# Patient Record
Sex: Female | Born: 1980 | Race: Black or African American | Hispanic: No | Marital: Single | Smoking: Former smoker
Health system: Southern US, Community
[De-identification: ages and names within clinical notes are randomized; demographics above are authoritative.]

## PROBLEM LIST (undated history)

## (undated) DIAGNOSIS — F32A Depression, unspecified: Secondary | ICD-10-CM

## (undated) DIAGNOSIS — R55 Syncope and collapse: Secondary | ICD-10-CM

## (undated) DIAGNOSIS — R7989 Other specified abnormal findings of blood chemistry: Secondary | ICD-10-CM

## (undated) DIAGNOSIS — F419 Anxiety disorder, unspecified: Secondary | ICD-10-CM

## (undated) DIAGNOSIS — D649 Anemia, unspecified: Secondary | ICD-10-CM

## (undated) DIAGNOSIS — I10 Essential (primary) hypertension: Secondary | ICD-10-CM

## (undated) DIAGNOSIS — R0600 Dyspnea, unspecified: Secondary | ICD-10-CM

## (undated) DIAGNOSIS — R87629 Unspecified abnormal cytological findings in specimens from vagina: Secondary | ICD-10-CM

## (undated) DIAGNOSIS — F329 Major depressive disorder, single episode, unspecified: Secondary | ICD-10-CM

## (undated) DIAGNOSIS — R945 Abnormal results of liver function studies: Secondary | ICD-10-CM

## (undated) DIAGNOSIS — G4719 Other hypersomnia: Secondary | ICD-10-CM

## (undated) DIAGNOSIS — T796XXA Traumatic ischemia of muscle, initial encounter: Secondary | ICD-10-CM

## (undated) DIAGNOSIS — R519 Headache, unspecified: Secondary | ICD-10-CM

## (undated) HISTORY — DX: Unspecified abnormal cytological findings in specimens from vagina: R87.629

## (undated) HISTORY — DX: Other hypersomnia: G47.19

## (undated) HISTORY — DX: Other specified abnormal findings of blood chemistry: R79.89

## (undated) HISTORY — DX: Morbid (severe) obesity due to excess calories: E66.01

## (undated) HISTORY — DX: Dyspnea, unspecified: R06.00

## (undated) HISTORY — DX: Headache, unspecified: R51.9

## (undated) HISTORY — DX: Syncope and collapse: R55

## (undated) HISTORY — PX: WISDOM TOOTH EXTRACTION: SHX21

## (undated) HISTORY — DX: Abnormal results of liver function studies: R94.5

## (undated) HISTORY — DX: Traumatic ischemia of muscle, initial encounter: T79.6XXA

## (undated) HISTORY — DX: Anemia, unspecified: D64.9

## (undated) HISTORY — PX: OTHER SURGICAL HISTORY: SHX169

---

## 2004-11-12 ENCOUNTER — Encounter: Admission: RE | Admit: 2004-11-12 | Discharge: 2004-11-12 | Payer: Self-pay | Admitting: Emergency Medicine

## 2005-11-06 ENCOUNTER — Emergency Department (HOSPITAL_COMMUNITY): Admission: EM | Admit: 2005-11-06 | Discharge: 2005-11-06 | Payer: Self-pay | Admitting: Emergency Medicine

## 2006-08-14 ENCOUNTER — Emergency Department (HOSPITAL_COMMUNITY): Admission: EM | Admit: 2006-08-14 | Discharge: 2006-08-14 | Payer: Self-pay | Admitting: Emergency Medicine

## 2010-04-16 ENCOUNTER — Ambulatory Visit (HOSPITAL_COMMUNITY)
Admission: RE | Admit: 2010-04-16 | Discharge: 2010-04-16 | Disposition: A | Discharge status: Home / Self Care | Payer: BC Managed Care – PPO | Source: Ambulatory Visit | Attending: Specialist | Admitting: Specialist

## 2010-04-16 ENCOUNTER — Other Ambulatory Visit (HOSPITAL_COMMUNITY): Payer: Self-pay | Admitting: Specialist

## 2010-04-16 DIAGNOSIS — R52 Pain, unspecified: Secondary | ICD-10-CM

## 2010-04-16 DIAGNOSIS — Z3689 Encounter for other specified antenatal screening: Secondary | ICD-10-CM | POA: Insufficient documentation

## 2010-04-16 DIAGNOSIS — R55 Syncope and collapse: Secondary | ICD-10-CM

## 2010-04-24 ENCOUNTER — Other Ambulatory Visit (HOSPITAL_COMMUNITY): Payer: Self-pay | Admitting: Specialist

## 2010-04-24 DIAGNOSIS — O3680X0 Pregnancy with inconclusive fetal viability, not applicable or unspecified: Secondary | ICD-10-CM

## 2010-04-27 ENCOUNTER — Ambulatory Visit (HOSPITAL_COMMUNITY)
Admission: RE | Admit: 2010-04-27 | Discharge: 2010-04-27 | Disposition: A | Discharge status: Home / Self Care | Payer: BC Managed Care – PPO | Source: Ambulatory Visit | Attending: Specialist | Admitting: Specialist

## 2010-04-27 DIAGNOSIS — Z3689 Encounter for other specified antenatal screening: Secondary | ICD-10-CM | POA: Insufficient documentation

## 2010-04-27 DIAGNOSIS — O3680X0 Pregnancy with inconclusive fetal viability, not applicable or unspecified: Secondary | ICD-10-CM

## 2010-12-20 ENCOUNTER — Ambulatory Visit (HOSPITAL_COMMUNITY)
Admission: RE | Admit: 2010-12-20 | Discharge: 2010-12-20 | Disposition: A | Discharge status: Home / Self Care | Payer: BC Managed Care – PPO | Source: Ambulatory Visit | Attending: Emergency Medicine | Admitting: Emergency Medicine

## 2010-12-20 NOTE — Progress Notes (Signed)
Adult Lactation Consultation Outpatient Visit Note  Patient Name: Ashley Pham             BABY : Ashley Pham  13 DO Date of Birth: 1980/05/23                    DOB:  [birth year] Gestational Age at Delivery: TERM   BIRTH WEIGHT:  5-8 Type of Delivery: NSVD                     WEIGHT TODAY:  5-11.8  Breastfeeding History: Frequency of Breastfeeding: ATTEMPTS EVERY 2-3 HOURS.  SUCCESSFUL LATCHES TWICE/24 HRS Length of Feeding: 10-30 MIN Voids: 6+ Stools:6+   Supplementing / Method:  BOTTLE EVERY 2- 3 HOURS   45-60 mls expressed milk Pumping:  Type of Pump:LACTINA DEBP   Frequency:3 TIMES/24 HOURS  Volume:  1/2 OZ-1 OZ EACH BREAST  Comments:    Consultation Evaluation:  Mother here with c/o of difficult latch.  Assisted with baby in football hold on right breast.  Milk easily expressed but baby unable to latch after several attempts.  #20 nipple shield placed and baby did latch and begin sucking but only few swallows heard.  SNS initiated and nipple shield removed and baby latched deeply and nursed for 15 minutes taking 40 mls of expressed milk from SNS.  Baby burped and placed in cross cradle hold on left breast but nipple shield needed to attain latch.  Baby nursed for 10 min with many audible swallows/gulps heard.  Mom pleased.  Plan:  1)  Feed baby on cue but at least every 3 hours  2)  Use nipple shield prn  3) Offer 30-45 mls of expressed milk per bottle after feedings or use SNS at breast with same amounts.  4) increase pumping to every 3 hours x 15-20 minutes after feedings  Initial Feeding Assessment: Pre-feed Weight:2602 Post-feed JYNWGN:[id number] (40 mls expressed milk from SNS) Amount Transferred:76 MLS TOTAL  Both breasts Comments:36 MLS + 40 MLS SNS  Additional Feeding Assessment: Pre-feed Weight: Post-feed Weight: Amount Transferred: Comments:  Additional Feeding Assessment: Pre-feed Weight: Post-feed Weight: Amount Transferred: Comments:  Total Breast milk  Transferred this Visit:  Total Supplement Given: NONE  Additional Interventions:   Follow-Up  OUTPATIENT LACTATION APPOINTMENT  Thursday December 13, 1:00PM      Ashley Pham 12/20/2010, 5:24 PM

## 2010-12-27 ENCOUNTER — Inpatient Hospital Stay (HOSPITAL_COMMUNITY): Admission: RE | Admit: 2010-12-27 | Discharge: 2010-12-27 | Payer: BC Managed Care – PPO | Source: Ambulatory Visit

## 2010-12-27 NOTE — Consult Note (Signed)
Adult Lactation Consultation Outpatient Visit Note  Patient Name: Ashley Pham  [name] Date of Birth: November 15, 1980  12-10-10 Gestational Age at Delivery: Unknown  39 5/7 weeks Type of Delivery: Vaginal  Breastfeeding History: Frequency of Breastfeeding:  Every 2 hours last 2 days Length of Feeding: 60 mins Voids: 4-5/day Stools: 4-5/day yellow seedy  Supplementing / Method: Pumping:  Type of Pump: Lactina (WIC pump)   Frequency: every 3 hrs  Volume:  30-45 mls  Comments:  Baby weighed 5 lbs. 15 oz today. Observed Tzipora latching baby in cross cradle hold.  Baby eagerly rooted, but latched onto nipple only.  Shunna said that was how she usually does at home (Mom has large breasts with semi flat nipples that evert with stimulation.  Baby has little mouth).  Explained, assisted, and demonstrated how to latch baby deeper onto breast.  Mom is aware of the difference and what to look for if baby is latched onto nipple only. At home Mom has been giving baby her pumped milk in a bottle (Nuk nipple).  She pumps 1- 1.5 oz per pumping, and is worried she doesn't have enough milk for baby.     Consultation Evaluation: Assistance given to reposition baby in cross cradle and then football hold.  Baby able to latch much better in football hold, and Mom able to support her better, and support her breast (rolled wash cloth under breast) more adequately.  Initial Feeding Assessment: Pre-feed Weight: 2694 gm Post-feed Weight: 2712 gm Amount Transferred: 18  ml Comments: left breast, without nipple shield using breast compression and skin to skin to keep baby nutritive. Baby became sleepy after 10 mins.    Additional Feeding Assessment: Pre-feed Weight: 2712 gm Post-feed Weight: 2726 gm Amount Transferred:14 ml Comments: right breast 15 mins.  Additional Feeding Assessment:with nipple shield Pre-feed Weight: 2726 gm Post-feed Weight: 2730 gm Amount Transferred: 4 ml Comments: 36  ml  Total Breast milk Transferred this Visit: 36 ml Total Supplement Given: 30 ml  Baby able to latch to breast deep enough to transfer milk, with lots of stimulation to keep her actively nursing.  Lots of teaching done on what a good latch and feeding looks and feels like.  Mom does not use the SNS, and use of nipple shield did not aid in milk transfer.  Mom to work on latching baby deep onto areola, rather than shallow on nipple. To give baby 30 ml expressed breast milk after breast, and pump both breasts 15 minutes. Locklyn's milk supply appears adequate but baby still unable to adequately transfer milk from the breast without supplementing.       Follow-Up  Tuesday, December 18th @ 1pm    Judee Clara 12/27/2010, 1:48 PM

## 2011-01-01 ENCOUNTER — Ambulatory Visit (HOSPITAL_COMMUNITY)
Admission: RE | Admit: 2011-01-01 | Discharge: 2011-01-01 | Disposition: A | Discharge status: Home / Self Care | Payer: BC Managed Care – PPO | Source: Ambulatory Visit | Attending: Emergency Medicine | Admitting: Emergency Medicine

## 2011-01-01 NOTE — Progress Notes (Signed)
Adult Lactation Consultation Outpatient Visit Note  Patient Name: Ashley Pham Date of Birth: May 21, 1980 Gestational Age at Delivery: Unknown Type of Delivery:vaginal del on 12/10/10   Breastfeeding History: Frequency of Breastfeeding: every 2hrs Length of Feeding: 30-45 mins Voids: 5-6 Stools: 4 yellow seedy  Supplementing / Method: Pumping:  Type of Pump:lactina   Frequency:3 times day  Volume:  15-30 ml  Comments: Follow up today for weight check and feeding assessment. Mother has been exclusively breastfeeding, has not given formula for 1 week. Mother is pumping 3-4 times daily and getting 1/2 -1 ounce each pumping session . Mother plans to return to work in January .    Consultation Evaluation: [place]ryah fed on first breast in x cradle hold for 20  Mins. Mother took her off several times for burps and to re-awake infant. Infant took 62 ml for first breast. Mother then placed her on 2nd breast in football hold and breast fed for apprx 12-15 mins. Infant transferred 26 ml. Infant took total of 88 ml for entire feeding. Mothers technique in latching infant independently was much improved. Mother was given lots of support and praise. Plan for mother to come to support group on January 8 for pre and post weights . Plan for mother to continue to pump at least 3 times daily after feedings.   Initial Feeding Assessment: Pre-feed ZOXWRU:0454 Post-feed [id number]:1478 Amount Transferred:28ml Comments:  Additional Feeding Assessment: Pre-feed GNFAOZ:3086 Post-feed VHQION:[id number] Amount Transferred:42ml Comments:  Additional Feeding Assessment: Pre-feed Weight: Post-feed Weight: Amount Transferred: Comments:  Total Breast milk Transferred this Visit: 88ml Total Supplement Given:   Additional Interventions:   Follow-Up  PRN    Stevan Born [facility] 01/01/2011, 1:27 PM

## 2013-09-28 ENCOUNTER — Emergency Department (HOSPITAL_COMMUNITY)
Admission: EM | Admit: 2013-09-28 | Discharge: 2013-09-28 | Disposition: A | Discharge status: Home / Self Care | Payer: BC Managed Care – PPO | Source: Home / Self Care

## 2013-09-28 ENCOUNTER — Encounter (HOSPITAL_COMMUNITY): Payer: Self-pay | Admitting: Emergency Medicine

## 2013-09-28 DIAGNOSIS — J3089 Other allergic rhinitis: Secondary | ICD-10-CM

## 2013-09-28 DIAGNOSIS — G4700 Insomnia, unspecified: Secondary | ICD-10-CM

## 2013-09-28 DIAGNOSIS — K219 Gastro-esophageal reflux disease without esophagitis: Secondary | ICD-10-CM

## 2013-09-28 HISTORY — DX: Essential (primary) hypertension: I10

## 2013-09-28 MED ORDER — PANTOPRAZOLE SODIUM 40 MG PO TBEC: 40.0000 mg | DELAYED_RELEASE_TABLET | Freq: Every day | ORAL | Status: DC

## 2013-09-28 MED ORDER — HYDROXYZINE HCL 50 MG PO TABS: ORAL_TABLET | ORAL | Status: DC

## 2013-09-28 MED ORDER — RANITIDINE HCL 150 MG PO CAPS: ORAL_CAPSULE | ORAL | Status: DC

## 2013-09-28 NOTE — Discharge Instructions (Signed)
Allergic Rhinitis Allegra or claritin Sudafed PE 10 mg for congestion Flonase nasal spray Lots of saline nasal spray  Allergic rhinitis is when the mucous membranes in the nose respond to allergens. Allergens are particles in the air that cause your body to have an allergic reaction. This causes you to release allergic antibodies. Through a chain of events, these eventually cause you to release histamine into the blood stream. Although meant to protect the body, it is this release of histamine that causes your discomfort, such as frequent sneezing, congestion, and an itchy, runny nose.  CAUSES  Seasonal allergic rhinitis (hay fever) is caused by pollen allergens that may come from grasses, trees, and weeds. Year-round allergic rhinitis (perennial allergic rhinitis) is caused by allergens such as house dust mites, pet dander, and mold spores.  SYMPTOMS   Nasal stuffiness (congestion).  Itchy, runny nose with sneezing and tearing of the eyes. DIAGNOSIS  Your health care provider can help you determine the allergen or allergens that trigger your symptoms. If you and your health care provider are unable to determine the allergen, skin or blood testing may be used. TREATMENT  Allergic rhinitis does not have a cure, but it can be controlled by:  Medicines and allergy shots (immunotherapy).  Avoiding the allergen. Hay fever may often be treated with antihistamines in pill or nasal spray forms. Antihistamines block the effects of histamine. There are over-the-counter medicines that may help with nasal congestion and swelling around the eyes. Check with your health care provider before taking or giving this medicine.  If avoiding the allergen or the medicine prescribed do not work, there are many new medicines your health care provider can prescribe. Stronger medicine may be used if initial measures are ineffective. Desensitizing injections can be used if medicine and avoidance does not work.  Desensitization is when a patient is given ongoing shots until the body becomes less sensitive to the allergen. Make sure you follow up with your health care provider if problems continue. HOME CARE INSTRUCTIONS It is not possible to completely avoid allergens, but you can reduce your symptoms by taking steps to limit your exposure to them. It helps to know exactly what you are allergic to so that you can avoid your specific triggers. SEEK MEDICAL CARE IF:   You have a fever.  You develop a cough that does not stop easily (persistent).  You have shortness of breath.  You start wheezing.  Symptoms interfere with normal daily activities. Document Released: 09/25/2000 Document Revised: 01/05/2013 Document Reviewed: 09/07/2012 [facility] Patient Information 2015 [place], [place]. This information is not intended to replace advice given to you by your health care provider. Make sure you discuss any questions you have with your health care provider.  Gastroesophageal Reflux Disease, Adult Gastroesophageal reflux disease (GERD) happens when acid from your stomach flows up into the esophagus. When acid comes in contact with the esophagus, the acid causes soreness (inflammation) in the esophagus. Over time, GERD may create small holes (ulcers) in the lining of the esophagus. CAUSES   Increased body weight. This puts pressure on the stomach, making acid rise from the stomach into the esophagus.  Smoking. This increases acid production in the stomach.  Drinking alcohol. This causes decreased pressure in the lower esophageal sphincter (valve or ring of muscle between the esophagus and stomach), allowing acid from the stomach into the esophagus.  Late evening meals and a full stomach. This increases pressure and acid production in the stomach.  A malformed lower  esophageal sphincter. Sometimes, no cause is found. SYMPTOMS   Burning pain in the lower part of the mid-chest behind the breastbone and in  the mid-stomach area. This may occur twice a week or more often.  Trouble swallowing.  Sore throat.  Dry cough.  Asthma-like symptoms including chest tightness, shortness of breath, or wheezing. DIAGNOSIS  Your caregiver may be able to diagnose GERD based on your symptoms. In some cases, X-rays and other tests may be done to check for complications or to check the condition of your stomach and esophagus. TREATMENT  Your caregiver may recommend over-the-counter or prescription medicines to help decrease acid production. Ask your caregiver before starting or adding any new medicines.  HOME CARE INSTRUCTIONS   Change the factors that you can control. Ask your caregiver for guidance concerning weight loss, quitting smoking, and alcohol consumption.  Avoid foods and drinks that make your symptoms worse, such as:  Caffeine or alcoholic drinks.  Chocolate.  Peppermint or mint flavorings.  Garlic and onions.  Spicy foods.  Citrus fruits, such as oranges, lemons, or limes.  Tomato-based foods such as sauce, chili, salsa, and pizza.  Fried and fatty foods.  Avoid lying down for the 3 hours prior to your bedtime or prior to taking a nap.  Eat small, frequent meals instead of large meals.  Wear loose-fitting clothing. Do not wear anything tight around your waist that causes pressure on your stomach.  Raise the head of your bed 6 to 8 inches with wood blocks to help you sleep. Extra pillows will not help.  Only take over-the-counter or prescription medicines for pain, discomfort, or fever as directed by your caregiver.  Do not take aspirin, ibuprofen, or other nonsteroidal anti-inflammatory drugs (NSAIDs). SEEK IMMEDIATE MEDICAL CARE IF:   You have pain in your arms, neck, jaw, teeth, or back.  Your pain increases or changes in intensity or duration.  You develop nausea, vomiting, or sweating (diaphoresis).  You develop shortness of breath, or you faint.  Your vomit is  green, yellow, black, or looks like coffee grounds or blood.  Your stool is red, bloody, or black. These symptoms could be signs of other problems, such as heart disease, gastric bleeding, or esophageal bleeding. MAKE SURE YOU:   Understand these instructions.  Will watch your condition.  Will get help right away if you are not doing well or get worse. Document Released: 10/10/2004 Document Revised: 03/25/2011 Document Reviewed: 07/20/2010 [facility] Patient Information 2015 [place], [place]. This information is not intended to replace advice given to you by your health care provider. Make sure you discuss any questions you have with your health care provider.  Food Choices for Gastroesophageal Reflux Disease When you have gastroesophageal reflux disease (GERD), the foods you eat and your eating habits are very important. Choosing the right foods can help ease your discomfort.  WHAT GUIDELINES DO I NEED TO FOLLOW?   Choose fruits, vegetables, whole grains, and low-fat dairy products.   Choose low-fat meat, fish, and poultry.  Limit fats such as oils, salad dressings, butter, nuts, and avocado.   Keep a food diary. This helps you identify foods that cause symptoms.   Avoid foods that cause symptoms. These may be different for everyone.   Eat small meals often instead of 3 large meals a day.   Eat your meals slowly, in a place where you are relaxed.   Limit fried foods.   Cook foods using methods other than frying.   Avoid drinking alcohol.  Avoid drinking large amounts of liquids with your meals.   Avoid bending over or lying down until 2-3 hours after eating.  WHAT FOODS ARE NOT RECOMMENDED?  These are some foods and drinks that may make your symptoms worse: Vegetables Tomatoes. Tomato juice. Tomato and spaghetti sauce. Chili peppers. Onion and garlic. Horseradish. Fruits Oranges, grapefruit, and lemon (fruit and juice). Meats High-fat meats, fish, and  poultry. This includes hot dogs, ribs, ham, sausage, salami, and bacon. Dairy Whole milk and chocolate milk. Sour cream. Cream. Butter. Ice cream. Cream cheese.  Drinks Coffee and tea. Bubbly (carbonated) drinks or energy drinks. Condiments Hot sauce. Barbecue sauce.  Sweets/Desserts Chocolate and cocoa. Donuts. Peppermint and spearmint. Fats and Oils High-fat foods. This includes [place] fries and potato chips. Other Vinegar. Strong spices. This includes black pepper, white pepper, red pepper, cayenne, curry powder, cloves, ginger, and chili powder. The items listed above may not be a complete list of foods and drinks to avoid. Contact your dietitian for more information. Document Released: 07/02/2011 Document Revised: 01/05/2013 Document Reviewed: 11/04/2012 [facility] Patient Information 2015 [place], [place]. This information is not intended to replace advice given to you by your health care provider. Make sure you discuss any questions you have with your health care provider.  Insomnia Insomnia is frequent trouble falling and/or staying asleep. Insomnia can be a long term problem or a short term problem. Both are common. Insomnia can be a short term problem when the wakefulness is related to a certain stress or worry. Long term insomnia is often related to ongoing stress during waking hours and/or poor sleeping habits. Overtime, sleep deprivation itself can make the problem worse. Every little thing feels more severe because you are overtired and your ability to cope is decreased. CAUSES   Stress, anxiety, and depression.  Poor sleeping habits.  Distractions such as TV in the bedroom.  Naps close to bedtime.  Engaging in emotionally charged conversations before bed.  Technical reading before sleep.  Alcohol and other sedatives. They may make the problem worse. They can hurt normal sleep patterns and normal dream activity.  Stimulants such as caffeine for several hours prior to  bedtime.  Pain syndromes and shortness of breath can cause insomnia.  Exercise late at night.  Changing time zones may cause sleeping problems (jet lag). It is sometimes helpful to have someone observe your sleeping patterns. They should look for periods of not breathing during the night (sleep apnea). They should also look to see how long those periods last. If you live alone or observers are uncertain, you can also be observed at a sleep clinic where your sleep patterns will be professionally monitored. Sleep apnea requires a checkup and treatment. Give your caregivers your medical history. Give your caregivers observations your family has made about your sleep.  SYMPTOMS   Not feeling rested in the morning.  Anxiety and restlessness at bedtime.  Difficulty falling and staying asleep. TREATMENT   Your caregiver may prescribe treatment for an underlying medical disorders. Your caregiver can give advice or help if you are using alcohol or other drugs for self-medication. Treatment of underlying problems will usually eliminate insomnia problems.  Medications can be prescribed for short time use. They are generally not recommended for lengthy use.  Over-the-counter sleep medicines are not recommended for lengthy use. They can be habit forming.  You can promote easier sleeping by making lifestyle changes such as:  Using relaxation techniques that help with breathing and reduce muscle tension.  Exercising  earlier in the day.  Changing your diet and the time of your last meal. No night time snacks.  Establish a regular time to go to bed.  Counseling can help with stressful problems and worry.  Soothing music and white noise may be helpful if there are background noises you cannot remove.  Stop tedious detailed work at least one hour before bedtime. HOME CARE INSTRUCTIONS   Keep a diary. Inform your caregiver about your progress. This includes any medication side effects. See your  caregiver regularly. Take note of:  Times when you are asleep.  Times when you are awake during the night.  The quality of your sleep.  How you feel the next day. This information will help your caregiver care for you.  Get out of bed if you are still awake after 15 minutes. Read or do some quiet activity. Keep the lights down. Wait until you feel sleepy and go back to bed.  Keep regular sleeping and waking hours. Avoid naps.  Exercise regularly.  Avoid distractions at bedtime. Distractions include watching television or engaging in any intense or detailed activity like attempting to balance the household checkbook.  Develop a bedtime ritual. Keep a familiar routine of bathing, brushing your teeth, climbing into bed at the same time each night, listening to soothing music. Routines increase the success of falling to sleep faster.  Use relaxation techniques. This can be using breathing and muscle tension release routines. It can also include visualizing peaceful scenes. You can also help control troubling or intruding thoughts by keeping your mind occupied with boring or repetitive thoughts like the old concept of counting sheep. You can make it more creative like imagining planting one beautiful flower after another in your backyard garden.  During your day, work to eliminate stress. When this is not possible use some of the previous suggestions to help reduce the anxiety that accompanies stressful situations. MAKE SURE YOU:   Understand these instructions.  Will watch your condition.  Will get help right away if you are not doing well or get worse. Document Released: 12/29/1999 Document Revised: 03/25/2011 Document Reviewed: 01/28/2007 [facility] Patient Information 2015 [place], [place]. This information is not intended to replace advice given to you by your health care provider. Make sure you discuss any questions you have with your health care provider.

## 2013-09-28 NOTE — ED Provider Notes (Signed)
CSN: [id number]     Arrival date & time 09/28/13  1346 History   First MD Initiated Contact with Patient 09/28/13 1505     Chief Complaint  Patient presents with  . Hypertension  . Sleeping Problem  . Cough   (Consider location/radiation/quality/duration/timing/severity/associated sxs/prior Treatment) HPI Comments: 33 year old obese female with complaints of elevated blood pressure this morning. She reports a 170/90. It was normal in urgent care. She had some mild dizziness and transient upset stomach. Last night she had heartburn which has continued to have some degree today. Is also complaining of inability to sleep at nighttime which has been going on for several months and a cold for 3 weeks.   Past Medical History  Diagnosis Date  . Hypertension   . Diabetes mellitus without complication    History reviewed. No pertinent past surgical history. History reviewed. No pertinent family history. History  Substance Use Topics  . Smoking status: Current Every Day Smoker -- 0.50 packs/day    Types: Cigarettes  . Smokeless tobacco: Not on file  . Alcohol Use: No   OB History   Grav Para Term Preterm Abortions TAB SAB Ect Mult Living                 Review of Systems  Constitutional: Negative for fever, chills, activity change, appetite change and fatigue.  HENT: Positive for congestion, postnasal drip and rhinorrhea. Negative for facial swelling.   Eyes: Negative.   Respiratory: Negative.   Cardiovascular: Negative.   Gastrointestinal: Negative for vomiting, abdominal pain and constipation.       As per history of present illness  Genitourinary: Negative.   Musculoskeletal: Negative for neck pain and neck stiffness.  Skin: Negative for pallor and rash.  Neurological: Positive for dizziness. Negative for tremors, seizures, syncope, speech difficulty, numbness and headaches.  Psychiatric/Behavioral: Positive for sleep disturbance.    Allergies  Review of patient's allergies  indicates no known allergies.  Home Medications   Prior to Admission medications   Medication Sig Start Date End Date Taking? Authorizing Provider  Sertraline HCl (ZOLOFT PO) Take by mouth.   Yes Historical Provider, MD  VERAPAMIL HCL ER, CO, PO Take by mouth.   Yes Historical Provider, MD  hydrOXYzine (ATARAX/VISTARIL) 50 MG tablet Take one one hour before hs prn sleep 09/28/13   Hayden Rasmussen, NP  pantoprazole (PROTONIX) 40 MG tablet Take 1 tablet (40 mg total) by mouth daily. 09/28/13   Hayden Rasmussen, NP  ranitidine (ZANTAC) 150 MG capsule Take one before hs. 09/28/13   Hayden Rasmussen, NP   BP 132/81  Pulse 93  Temp(Src) 98.3 F (36.8 C) (Oral)  Resp 16  SpO2 100% Physical Exam  Nursing note and vitals reviewed. Constitutional: She is oriented to person, place, and time. She appears well-developed and well-nourished. No distress.  HENT:  Head: Normocephalic.  Right Ear: External ear normal.  Left Ear: External ear normal.  Mouth/Throat: No oropharyngeal exudate.  Minor erythema of the posterior oropharynx, cobblestoning. No exudates or edema  Eyes: Conjunctivae and EOM are normal. Pupils are equal, round, and reactive to light.  Neck: Normal range of motion. Neck supple.  Cardiovascular: Normal rate and regular rhythm.   No murmur heard. Pulmonary/Chest: Effort normal. No respiratory distress. She has no rales.  Abdominal: Soft. There is no tenderness.  Musculoskeletal: Normal range of motion.  Lymphadenopathy:    She has no cervical adenopathy.  Neurological: She is alert and oriented to person, place, and time. No cranial  nerve deficit.  Skin: Skin is warm and dry.  Psychiatric: She has a normal mood and affect.    ED Course  Procedures (including critical care time) Labs Review Labs Reviewed - No data to display  Imaging Review No results found.   MDM   1. Other allergic rhinitis   2. Gastroesophageal reflux disease without esophagitis   3. Insomnia    protonix 40 mg   Zantac 150 mg  Vistaril 50 mg #15 Allegra or claritin Sudafed PE 10 mg for congestion Flonase nasal spray Lots of saline nasal spray      Hayden Rasmussen, NP 09/28/13 1554

## 2013-09-28 NOTE — ED Notes (Signed)
Presents with several complaints.   Pt c/o  Productive cough with yellow to green sputum.  Heart burn.  Abdominal cramping.  Light headedness.  Nausea.    Pt has tried resting with no relief in symptoms.   States "I have to take tylenol P.M to fall asleep but has trouble staying asleep.   Reports BP of 170/90,  States takes meds as presribed.

## 2013-10-01 NOTE — ED Provider Notes (Signed)
Medical screening examination/treatment/procedure(s) were performed by resident physician or non-physician practitioner and as supervising physician I was immediately available for consultation/collaboration.   KINDL,JAMES DOUGLAS MD.   James D Kindl, MD 10/01/13 1637 

## 2013-11-08 DIAGNOSIS — T8383XA Hemorrhage of genitourinary prosthetic devices, implants and grafts, initial encounter: Secondary | ICD-10-CM

## 2013-11-08 DIAGNOSIS — G43109 Migraine with aura, not intractable, without status migrainosus: Secondary | ICD-10-CM | POA: Insufficient documentation

## 2013-11-08 DIAGNOSIS — R350 Frequency of micturition: Secondary | ICD-10-CM | POA: Insufficient documentation

## 2013-11-08 DIAGNOSIS — N898 Other specified noninflammatory disorders of vagina: Secondary | ICD-10-CM | POA: Insufficient documentation

## 2013-11-08 DIAGNOSIS — Z975 Presence of (intrauterine) contraceptive device: Secondary | ICD-10-CM | POA: Insufficient documentation

## 2013-11-08 DIAGNOSIS — N923 Ovulation bleeding: Secondary | ICD-10-CM | POA: Insufficient documentation

## 2013-11-08 DIAGNOSIS — F341 Dysthymic disorder: Secondary | ICD-10-CM | POA: Insufficient documentation

## 2013-11-08 DIAGNOSIS — B9689 Other specified bacterial agents as the cause of diseases classified elsewhere: Secondary | ICD-10-CM | POA: Insufficient documentation

## 2013-11-08 DIAGNOSIS — N760 Acute vaginitis: Secondary | ICD-10-CM | POA: Insufficient documentation

## 2014-10-28 ENCOUNTER — Encounter (HOSPITAL_COMMUNITY): Payer: Self-pay | Admitting: Emergency Medicine

## 2014-10-28 ENCOUNTER — Emergency Department (HOSPITAL_COMMUNITY)
Admission: EM | Admit: 2014-10-28 | Discharge: 2014-10-28 | Disposition: A | Discharge status: Home / Self Care | Payer: BLUE CROSS/BLUE SHIELD | Attending: Emergency Medicine | Admitting: Emergency Medicine

## 2014-10-28 DIAGNOSIS — Z3202 Encounter for pregnancy test, result negative: Secondary | ICD-10-CM | POA: Diagnosis not present

## 2014-10-28 DIAGNOSIS — I10 Essential (primary) hypertension: Secondary | ICD-10-CM | POA: Diagnosis not present

## 2014-10-28 DIAGNOSIS — R197 Diarrhea, unspecified: Secondary | ICD-10-CM | POA: Insufficient documentation

## 2014-10-28 DIAGNOSIS — R0981 Nasal congestion: Secondary | ICD-10-CM | POA: Insufficient documentation

## 2014-10-28 DIAGNOSIS — R350 Frequency of micturition: Secondary | ICD-10-CM | POA: Insufficient documentation

## 2014-10-28 DIAGNOSIS — R112 Nausea with vomiting, unspecified: Secondary | ICD-10-CM | POA: Insufficient documentation

## 2014-10-28 DIAGNOSIS — R55 Syncope and collapse: Secondary | ICD-10-CM | POA: Diagnosis not present

## 2014-10-28 DIAGNOSIS — R0602 Shortness of breath: Secondary | ICD-10-CM | POA: Diagnosis not present

## 2014-10-28 DIAGNOSIS — R109 Unspecified abdominal pain: Secondary | ICD-10-CM | POA: Insufficient documentation

## 2014-10-28 DIAGNOSIS — H9201 Otalgia, right ear: Secondary | ICD-10-CM | POA: Insufficient documentation

## 2014-10-28 DIAGNOSIS — Z79899 Other long term (current) drug therapy: Secondary | ICD-10-CM | POA: Insufficient documentation

## 2014-10-28 DIAGNOSIS — R42 Dizziness and giddiness: Secondary | ICD-10-CM

## 2014-10-28 DIAGNOSIS — Z720 Tobacco use: Secondary | ICD-10-CM | POA: Insufficient documentation

## 2014-10-28 DIAGNOSIS — J3489 Other specified disorders of nose and nasal sinuses: Secondary | ICD-10-CM | POA: Insufficient documentation

## 2014-10-28 LAB — CBC WITH DIFFERENTIAL/PLATELET
BASOS ABS: 0 10*3/uL (ref 0.0–0.1)
Basophils Relative: 0 %
EOS PCT: 1 %
Eosinophils Absolute: 0 10*3/uL (ref 0.0–0.7)
HCT: 37 % (ref 36.0–46.0)
Hemoglobin: 12.2 g/dL (ref 12.0–15.0)
Lymphocytes Relative: 23 %
Lymphs Abs: 1.8 10*3/uL (ref 0.7–4.0)
MCH: 27.2 pg (ref 26.0–34.0)
MCHC: 33 g/dL (ref 30.0–36.0)
MCV: 82.4 fL (ref 78.0–100.0)
Monocytes Absolute: 0.4 10*3/uL (ref 0.1–1.0)
Monocytes Relative: 5 %
Neutro Abs: 5.7 10*3/uL (ref 1.7–7.7)
Neutrophils Relative %: 71 %
Platelets: 216 10*3/uL (ref 150–400)
RBC: 4.49 MIL/uL (ref 3.87–5.11)
RDW: 13.5 % (ref 11.5–15.5)
WBC: 8 10*3/uL (ref 4.0–10.5)

## 2014-10-28 LAB — URINALYSIS, ROUTINE W REFLEX MICROSCOPIC
Bilirubin Urine: NEGATIVE
Glucose, UA: NEGATIVE mg/dL
Hgb urine dipstick: NEGATIVE
KETONES UR: NEGATIVE mg/dL
Leukocytes, UA: NEGATIVE
Nitrite: NEGATIVE
Protein, ur: NEGATIVE mg/dL
Specific Gravity, Urine: 1.028 (ref 1.005–1.030)
Urobilinogen, UA: 1 mg/dL (ref 0.0–1.0)
pH: 6.5 (ref 5.0–8.0)

## 2014-10-28 LAB — COMPREHENSIVE METABOLIC PANEL
ALT: 9 U/L — ABNORMAL LOW (ref 14–54)
AST: 19 U/L (ref 15–41)
Albumin: 3.7 g/dL (ref 3.5–5.0)
Alkaline Phosphatase: 44 U/L (ref 38–126)
Anion gap: 4 — ABNORMAL LOW (ref 5–15)
BUN: 9 mg/dL (ref 6–20)
CO2: 28 mmol/L (ref 22–32)
Calcium: 8.5 mg/dL — ABNORMAL LOW (ref 8.9–10.3)
Chloride: 105 mmol/L (ref 101–111)
Creatinine, Ser: 0.71 mg/dL (ref 0.44–1.00)
GFR calc non Af Amer: >60 mL/min (ref >60)
Glucose, Bld: 86 mg/dL (ref 65–99)
POTASSIUM: 4.1 mmol/L (ref 3.5–5.1)
Sodium: 137 mmol/L (ref 135–145)
Total Bilirubin: 0.6 mg/dL (ref 0.3–1.2)
Total Protein: 7 g/dL (ref 6.5–8.1)

## 2014-10-28 LAB — POC URINE PREG, ED: Preg Test, Ur: NEGATIVE

## 2014-10-28 LAB — LIPASE, BLOOD: Lipase: 32 U/L (ref 22–51)

## 2014-10-28 MED ORDER — ONDANSETRON 4 MG PO TBDP
4.0000 mg | ORAL_TABLET | Freq: Three times a day (TID) | ORAL | Status: DC | PRN
Start: 2014-10-28 — End: 2015-03-13

## 2014-10-28 MED ORDER — SODIUM CHLORIDE 0.9 % IV BOLUS (SEPSIS)
1000.0000 mL | Freq: Once | INTRAVENOUS | Status: AC
  Administered 2014-10-28: 1000 mL via INTRAVENOUS

## 2014-10-28 MED ORDER — ONDANSETRON 4 MG PO TBDP
4.0000 mg | ORAL_TABLET | Freq: Once | ORAL | Status: DC
  Filled 2014-10-28: qty 1

## 2014-10-28 NOTE — ED Notes (Signed)
C/o nausea asking for a zofran prior to leaving.

## 2014-10-28 NOTE — ED Notes (Signed)
Bed: [id number] Expected date:  Expected time:  Means of arrival:  Comments: EMS- 34yo F, n/v/d, dizziness

## 2014-10-28 NOTE — ED Provider Notes (Signed)
CSN: [id number]     Arrival date & time 10/28/14  0903 History   First MD Initiated Contact with Patient 10/28/14 0913     No chief complaint on file.    (Consider location/radiation/quality/duration/timing/severity/associated sxs/prior Treatment) Patient is a 34 y.o. female presenting with syncope.  Loss of Consciousness Episode history:  Multiple (once per day over last week) Most recent episode:  Yesterday (three times yesterday) Duration: 1-3 hours. Timing: 1/day for past week. Progression:  Unchanged Chronicity:  New Context: dehydration, standing up (turning head) and urination   Witnessed: yes (by child)   Relieved by: if hold head it feels better. Exacerbated by: motion, standing. 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 Hypertension    No past surgical history on file. No family history on file. Social History  Substance Use Topics  . Smoking status: Current Every Day Smoker -- 0.50 packs/day    Types: Cigarettes  . Smokeless tobacco: None  . Alcohol Use: No   OB History    No data available     Review of Systems  Constitutional: Negative for fever and fatigue.  HENT: Positive for congestion, ear pain (right earw 2 wk) and rhinorrhea (2wk). Negative for sore throat.   Eyes: Negative for visual disturbance.  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 Negative for cough.   Cardiovascular: Positive for syncope. Negative for chest pain.  Gastrointestinal: Positive for nausea, vomiting (once per day), abdominal pain (for 1 month comes and goes, cramping pain) and diarrhea (twice per day for one month, watery no blood). Negative for blood in stool and anal bleeding.  Genitourinary:  Positive for frequency. Negative for dysuria and difficulty urinating.  Musculoskeletal: Negative for back pain and neck pain.  Skin: Negative for rash.  Neurological: Negative for syncope and headaches.      Allergies  Review of patient's allergies indicates no known allergies.  Home Medications   Prior to Admission medications   Medication Sig Start Date End Date Taking? Authorizing Provider  sertraline (ZOLOFT) 100 MG tablet Take 150 mg by mouth daily. 08/29/14  Yes Historical Provider, MD  verapamil (VERELAN PM) 120 MG 24 hr capsule Take 1 capsule by mouth daily. 07/27/14  Yes Historical Provider, MD  hydrOXYzine (ATARAX/VISTARIL) 50 MG tablet Take one one hour before hs prn sleep Patient not taking: Reported on 10/28/2014 09/28/13   Hayden Rasmussen, NP  ondansetron (ZOFRAN ODT) 4 MG disintegrating tablet Take 1 tablet (4 mg total) by mouth every 8 (eight) hours as needed for nausea or vomiting. 10/28/14   Alvira Monday, MD  pantoprazole (PROTONIX) 40 MG tablet Take 1 tablet (40 mg total) by mouth daily. Patient not taking: Reported on 10/28/2014 09/28/13   Hayden Rasmussen, NP  ranitidine (ZANTAC) 150 MG capsule Take one before hs. Patient not taking: Reported on 10/28/2014 09/28/13   Hayden Rasmussen, NP   BP 132/87 mmHg  Pulse 105  Temp(Src) 98.7 F (37.1 C) (Oral)  Resp 20  SpO2 100% Physical Exam  Constitutional: She is oriented to person, place, and time. She appears well-developed and well-nourished. No distress.  HENT:  Head: Normocephalic and atraumatic.  Eyes: Conjunctivae and EOM are normal.  Neck: Normal range of motion.  Cardiovascular: Normal rate, regular rhythm, normal heart sounds and intact distal pulses.  Exam reveals no gallop and no friction rub.   No murmur heard. Pulmonary/Chest: Effort normal and breath sounds normal. No respiratory distress. She has no wheezes. She has no rales.  Abdominal: Soft. She exhibits no distension. There is no tenderness. There is no  guarding.  Musculoskeletal: She exhibits no edema or tenderness.  Neurological: She is alert and oriented to person, place, and time. She has normal strength. No cranial nerve deficit or sensory deficit. Coordination and gait normal. GCS eye subscore is 4. GCS verbal subscore is 5. GCS motor subscore is 6.  Skin: Skin is warm and dry. No rash noted. She is not diaphoretic. No erythema.  Nursing note and vitals reviewed.   ED Course  Procedures (including critical care time) Labs Review Labs Reviewed  COMPREHENSIVE METABOLIC PANEL - Abnormal; Notable for the following:    Calcium 8.5 (*)    ALT 9 (*)    Anion gap 4 (*)    All other components within normal limits  URINE CULTURE  CBC WITH DIFFERENTIAL/PLATELET  LIPASE, BLOOD  URINALYSIS, ROUTINE W REFLEX MICROSCOPIC (NOT [facility])  POC URINE PREG, ED    Imaging Review No results found. I have personally reviewed and evaluated these images and lab results as part of my medical decision-making.   EKG Interpretation   Date/Time:  Friday October 28 2014 09:21:39 EDT Ventricular Rate:  69 PR Interval:  174 QRS Duration: 61 QT Interval:  368 QTC Calculation: 394 R Axis:   44 Text Interpretation:  Sinus rhythm Nonspecific TW abnormality No previous  ECGs available Confirmed by [facility] MD, Modesta Sammons ([id number]) on 10/28/2014  10:22:11 AM      MDM   Final diagnoses:  Lightheadedness  Syncope, unspecified syncope type  Diarrhea, unspecified type  Non-intractable vomiting with nausea, vomiting of unspecified type    34 year old female with a history of hypertension presents with concern of one month of nausea, vomiting, diarrhea and lightheadedness with daily episodes of syncope for the last week.  DDx for syncope includes cardiac arrhythmia, MI, PE, electrolyte abnormality, hypovolemia including dehydration and anemia/GI bleed, infection. No chest pain, patient PERC negative and doubt acute MI/PE.   EKG evaluated by me and shows  sinus rhythm with no sign of prolonged QTc, no brugada, no sign of HOCM, no ST abnormalities.  Labs show no sign of anemia, normal electrolytes, patient appears well hydrated on exam.  No signs of cardiac arrhythmia on the monitor. History is not consistent with seizures, no loss of control bowel/bladder or postictal state.  Given duration described of episodes (2 hours of syncope per pt) have low suspicion that these represent cardiac arrhythmia in setting of completely normal recovery.  Recommend close PCP follow-up regarding symptoms.  Regarding abdominal pain, this is been present intermittently for one month, associated with diarrhea, and have low suspicion for appendicitis, diverticulitis, tubo-ovarian abscess.  No sign of pancreatitis, pyelonephritis, cholecystitis. Patient discharged in stable condition with understanding of reasons to return.      Alvira MondayErin Oral Remache, MD 10/29/14 [phone]

## 2014-10-28 NOTE — ED Notes (Signed)
Per EMS reports she has had diarrhea and vomiting x 1 month. Diarrhea has been "watery". Pt reports for the past week she is "passing out at 6pm" and waking up and not remembering what has happened. EMS called because she was dizzy at work this am but is not c/o of dizziness now. Did not have orthostatic changes with EMS.

## 2014-10-29 LAB — URINE CULTURE: Culture: NO GROWTH

## 2015-03-11 ENCOUNTER — Emergency Department (HOSPITAL_COMMUNITY): Payer: BLUE CROSS/BLUE SHIELD

## 2015-03-11 ENCOUNTER — Encounter (HOSPITAL_COMMUNITY): Payer: Self-pay | Admitting: *Deleted

## 2015-03-11 ENCOUNTER — Inpatient Hospital Stay (HOSPITAL_COMMUNITY)
Admission: EM | Admit: 2015-03-11 | Discharge: 2015-03-13 | DRG: 312 | Disposition: A | Discharge status: Home / Self Care | Payer: BLUE CROSS/BLUE SHIELD | Attending: Internal Medicine | Admitting: Internal Medicine

## 2015-03-11 DIAGNOSIS — R55 Syncope and collapse: Principal | ICD-10-CM

## 2015-03-11 DIAGNOSIS — Z6841 Body Mass Index (BMI) 40.0 and over, adult: Secondary | ICD-10-CM

## 2015-03-11 DIAGNOSIS — D649 Anemia, unspecified: Secondary | ICD-10-CM | POA: Diagnosis present

## 2015-03-11 DIAGNOSIS — D72829 Elevated white blood cell count, unspecified: Secondary | ICD-10-CM | POA: Diagnosis present

## 2015-03-11 DIAGNOSIS — R7989 Other specified abnormal findings of blood chemistry: Secondary | ICD-10-CM | POA: Diagnosis not present

## 2015-03-11 DIAGNOSIS — R778 Other specified abnormalities of plasma proteins: Secondary | ICD-10-CM | POA: Diagnosis present

## 2015-03-11 DIAGNOSIS — I10 Essential (primary) hypertension: Secondary | ICD-10-CM | POA: Diagnosis present

## 2015-03-11 DIAGNOSIS — R079 Chest pain, unspecified: Secondary | ICD-10-CM | POA: Diagnosis not present

## 2015-03-11 DIAGNOSIS — Z79899 Other long term (current) drug therapy: Secondary | ICD-10-CM

## 2015-03-11 DIAGNOSIS — F17210 Nicotine dependence, cigarettes, uncomplicated: Secondary | ICD-10-CM | POA: Diagnosis present

## 2015-03-11 DIAGNOSIS — R945 Abnormal results of liver function studies: Secondary | ICD-10-CM

## 2015-03-11 DIAGNOSIS — T796XXA Traumatic ischemia of muscle, initial encounter: Secondary | ICD-10-CM | POA: Diagnosis present

## 2015-03-11 HISTORY — DX: Depression, unspecified: F32.A

## 2015-03-11 HISTORY — DX: Anxiety disorder, unspecified: F41.9

## 2015-03-11 HISTORY — DX: Other specified abnormalities of plasma proteins: R77.8

## 2015-03-11 HISTORY — DX: Major depressive disorder, single episode, unspecified: F32.9

## 2015-03-11 HISTORY — DX: Syncope and collapse: R55

## 2015-03-11 HISTORY — DX: Other specified abnormal findings of blood chemistry: R79.89

## 2015-03-11 LAB — CBC WITH DIFFERENTIAL/PLATELET
Basophils Absolute: 0 10*3/uL (ref 0.0–0.1)
Basophils Relative: 0 %
Eosinophils Absolute: 0 10*3/uL (ref 0.0–0.7)
Eosinophils Relative: 0 %
HEMATOCRIT: 37.7 % (ref 36.0–46.0)
Hemoglobin: 12.2 g/dL (ref 12.0–15.0)
LYMPHS ABS: 0.7 10*3/uL (ref 0.7–4.0)
LYMPHS PCT: 4 %
MCH: 26.8 pg (ref 26.0–34.0)
MCHC: 32.4 g/dL (ref 30.0–36.0)
MCV: 82.7 fL (ref 78.0–100.0)
MONO ABS: 1.1 10*3/uL — AB (ref 0.1–1.0)
MONOS PCT: 6 %
NEUTROS ABS: 17.2 10*3/uL — AB (ref 1.7–7.7)
Neutrophils Relative %: 90 %
Platelets: 204 10*3/uL (ref 150–400)
RBC: 4.56 MIL/uL (ref 3.87–5.11)
RDW: 14.3 % (ref 11.5–15.5)
WBC: 19 10*3/uL — ABNORMAL HIGH (ref 4.0–10.5)

## 2015-03-11 LAB — COMPREHENSIVE METABOLIC PANEL
ALT: 68 U/L — ABNORMAL HIGH (ref 14–54)
ANION GAP: 10 (ref 5–15)
AST: 149 U/L — AB (ref 15–41)
Albumin: 4.1 g/dL (ref 3.5–5.0)
Alkaline Phosphatase: 52 U/L (ref 38–126)
BILIRUBIN TOTAL: 0.4 mg/dL (ref 0.3–1.2)
BUN: 10 mg/dL (ref 6–20)
CO2: 22 mmol/L (ref 22–32)
Calcium: 8.8 mg/dL — ABNORMAL LOW (ref 8.9–10.3)
Chloride: 104 mmol/L (ref 101–111)
Creatinine, Ser: 0.82 mg/dL (ref 0.44–1.00)
GFR calc Af Amer: >60 mL/min (ref >60)
Glucose, Bld: 107 mg/dL — ABNORMAL HIGH (ref 65–99)
POTASSIUM: 3.9 mmol/L (ref 3.5–5.1)
Sodium: 136 mmol/L (ref 135–145)
TOTAL PROTEIN: 7.4 g/dL (ref 6.5–8.1)

## 2015-03-11 LAB — URINALYSIS, ROUTINE W REFLEX MICROSCOPIC
Bilirubin Urine: NEGATIVE
Glucose, UA: NEGATIVE mg/dL
Ketones, ur: NEGATIVE mg/dL
Leukocytes, UA: NEGATIVE
Nitrite: NEGATIVE
Protein, ur: 100 mg/dL — AB
SPECIFIC GRAVITY, URINE: 1.028 (ref 1.005–1.030)
pH: 6 (ref 5.0–8.0)

## 2015-03-11 LAB — URINE MICROSCOPIC-ADD ON: BACTERIA UA: NONE SEEN

## 2015-03-11 LAB — CBC
HEMATOCRIT: 33 % — AB (ref 36.0–46.0)
Hemoglobin: 10.7 g/dL — ABNORMAL LOW (ref 12.0–15.0)
MCH: 26.4 pg (ref 26.0–34.0)
MCHC: 32.4 g/dL (ref 30.0–36.0)
MCV: 81.3 fL (ref 78.0–100.0)
PLATELETS: 188 10*3/uL (ref 150–400)
RBC: 4.06 MIL/uL (ref 3.87–5.11)
RDW: 14.1 % (ref 11.5–15.5)
WBC: 13.7 10*3/uL — AB (ref 4.0–10.5)

## 2015-03-11 LAB — RAPID URINE DRUG SCREEN, HOSP PERFORMED
Amphetamines: NOT DETECTED
BARBITURATES: NOT DETECTED
BENZODIAZEPINES: NOT DETECTED
COCAINE: NOT DETECTED
OPIATES: NOT DETECTED
Tetrahydrocannabinol: POSITIVE — AB

## 2015-03-11 LAB — CREATININE, SERUM
CREATININE: 0.67 mg/dL (ref 0.44–1.00)
GFR calc Af Amer: >60 mL/min (ref >60)

## 2015-03-11 LAB — I-STAT TROPONIN, ED: TROPONIN I, POC: 0.1 ng/mL — AB (ref 0.00–0.08)

## 2015-03-11 LAB — PHOSPHORUS: PHOSPHORUS: 2.6 mg/dL (ref 2.5–4.6)

## 2015-03-11 LAB — TSH: TSH: 0.53 u[IU]/mL (ref 0.350–4.500)

## 2015-03-11 LAB — TROPONIN I: TROPONIN I: 0.13 ng/mL — AB (ref <0.031)

## 2015-03-11 LAB — ETHANOL: Alcohol, Ethyl (B): <5 mg/dL (ref <5)

## 2015-03-11 LAB — I-STAT BETA HCG BLOOD, ED (MC, WL, AP ONLY): I-stat hCG, quantitative: <5 m[IU]/mL (ref <5)

## 2015-03-11 LAB — MAGNESIUM: MAGNESIUM: 1.7 mg/dL (ref 1.7–2.4)

## 2015-03-11 MED ORDER — IOHEXOL 300 MG/ML  SOLN
100.0000 mL | Freq: Once | INTRAMUSCULAR | Status: AC | PRN
  Administered 2015-03-11: 100 mL via INTRAVENOUS

## 2015-03-11 MED ORDER — ENOXAPARIN SODIUM 40 MG/0.4ML ~~LOC~~ SOLN
40.0000 mg | SUBCUTANEOUS | Status: DC
  Administered 2015-03-11 – 2015-03-12 (×2): 40 mg via SUBCUTANEOUS
  Filled 2015-03-11: qty 0.4

## 2015-03-11 MED ORDER — PANTOPRAZOLE SODIUM 40 MG PO TBEC: 40.0000 mg | DELAYED_RELEASE_TABLET | Freq: Every day | ORAL | Status: DC

## 2015-03-11 MED ORDER — SODIUM CHLORIDE 0.9 % IV SOLN
INTRAVENOUS | Status: DC
  Administered 2015-03-11 (×2): via INTRAVENOUS

## 2015-03-11 MED ORDER — ACETAMINOPHEN 325 MG PO TABS: 650.0000 mg | ORAL_TABLET | Freq: Four times a day (QID) | ORAL | Status: DC | PRN

## 2015-03-11 MED ORDER — BACITRACIN ZINC 500 UNIT/GM EX OINT
TOPICAL_OINTMENT | Freq: Two times a day (BID) | CUTANEOUS | Status: DC
  Administered 2015-03-11: 1 via TOPICAL
  Administered 2015-03-11: 23:00:00 via TOPICAL
  Administered 2015-03-12: 31.5556 via TOPICAL
  Administered 2015-03-12: 10:00:00 via TOPICAL
  Filled 2015-03-11: qty 0.9
  Filled 2015-03-11: qty 28.35

## 2015-03-11 MED ORDER — ONDANSETRON HCL 4 MG PO TABS: 4.0000 mg | ORAL_TABLET | Freq: Four times a day (QID) | ORAL | Status: DC | PRN

## 2015-03-11 MED ORDER — FAMOTIDINE 20 MG PO TABS: 10.0000 mg | ORAL_TABLET | Freq: Every day | ORAL | Status: DC

## 2015-03-11 MED ORDER — ONDANSETRON HCL 4 MG/2ML IJ SOLN: 4.0000 mg | Freq: Four times a day (QID) | INTRAMUSCULAR | Status: DC | PRN

## 2015-03-11 MED ORDER — VERAPAMIL HCL ER 120 MG PO TBCR
120.0000 mg | EXTENDED_RELEASE_TABLET | Freq: Every day | ORAL | Status: DC
  Administered 2015-03-11 – 2015-03-13 (×3): 120 mg via ORAL
  Filled 2015-03-11 (×3): qty 1

## 2015-03-11 MED ORDER — SODIUM CHLORIDE 0.9 % IV BOLUS (SEPSIS): 1000.0000 mL | Freq: Once | INTRAVENOUS | Status: DC

## 2015-03-11 MED ORDER — OXYCODONE HCL 5 MG PO TABS
5.0000 mg | ORAL_TABLET | ORAL | Status: DC | PRN
  Administered 2015-03-12 – 2015-03-13 (×5): 5 mg via ORAL
  Filled 2015-03-11 (×5): qty 1

## 2015-03-11 MED ORDER — TETANUS-DIPHTH-ACELL PERTUSSIS 5-2.5-18.5 LF-MCG/0.5 IM SUSP
0.5000 mL | Freq: Once | INTRAMUSCULAR | Status: AC
  Administered 2015-03-11: 0.5 mL via INTRAMUSCULAR
  Filled 2015-03-11: qty 0.5

## 2015-03-11 MED ORDER — HYDROXYZINE HCL 10 MG PO TABS
10.0000 mg | ORAL_TABLET | Freq: Three times a day (TID) | ORAL | Status: DC | PRN
  Filled 2015-03-11: qty 1

## 2015-03-11 MED ORDER — SERTRALINE HCL 50 MG PO TABS
150.0000 mg | ORAL_TABLET | Freq: Every day | ORAL | Status: DC
  Administered 2015-03-11 – 2015-03-13 (×3): 150 mg via ORAL
  Filled 2015-03-11 (×4): qty 1

## 2015-03-11 MED ORDER — IBUPROFEN 400 MG PO TABS
400.0000 mg | ORAL_TABLET | Freq: Once | ORAL | Status: AC
  Administered 2015-03-11: 400 mg via ORAL
  Filled 2015-03-11: qty 1

## 2015-03-11 MED ORDER — SODIUM CHLORIDE 0.9% FLUSH
3.0000 mL | Freq: Two times a day (BID) | INTRAVENOUS | Status: DC
  Administered 2015-03-12: 3 mL via INTRAVENOUS

## 2015-03-11 MED ORDER — ASPIRIN 81 MG PO CHEW
324.0000 mg | CHEWABLE_TABLET | Freq: Once | ORAL | Status: AC
  Administered 2015-03-11: 324 mg via ORAL
  Filled 2015-03-11: qty 4

## 2015-03-11 MED ORDER — LEVALBUTEROL HCL 0.63 MG/3ML IN NEBU
0.6300 mg | INHALATION_SOLUTION | Freq: Four times a day (QID) | RESPIRATORY_TRACT | Status: DC | PRN
  Filled 2015-03-11: qty 3

## 2015-03-11 MED ORDER — ACETAMINOPHEN 650 MG RE SUPP: 650.0000 mg | Freq: Four times a day (QID) | RECTAL | Status: DC | PRN

## 2015-03-11 MED ORDER — ACETAMINOPHEN 325 MG PO TABS
650.0000 mg | ORAL_TABLET | Freq: Once | ORAL | Status: AC
  Administered 2015-03-11: 650 mg via ORAL
  Filled 2015-03-11: qty 2
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
## 2015-03-11 NOTE — ED Notes (Signed)
Attempted report x1. 

## 2015-03-11 NOTE — ED Notes (Signed)
Pt ambulated independently to the bathroom 

## 2015-03-11 NOTE — ED Notes (Signed)
Patient transported to X-ray 

## 2015-03-11 NOTE — Progress Notes (Signed)
PT refused blood gas. I asked several times and she continued to refuse
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
## 2015-03-11 NOTE — ED Notes (Signed)
Patient transported to CT 

## 2015-03-11 NOTE — ED Notes (Signed)
Printer not working on pod D. Told charge, told Delo to look in computer
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
## 2015-03-11 NOTE — Progress Notes (Signed)
Pt received from ED. Pt oriented to room and equipment, initial call to CCMD placed. Call light within reach. Will continue to monitor.   Leonidas Romberg, RN

## 2015-03-12 ENCOUNTER — Inpatient Hospital Stay (HOSPITAL_COMMUNITY): Payer: BLUE CROSS/BLUE SHIELD

## 2015-03-12 DIAGNOSIS — R7989 Other specified abnormal findings of blood chemistry: Secondary | ICD-10-CM

## 2015-03-12 DIAGNOSIS — R079 Chest pain, unspecified: Secondary | ICD-10-CM

## 2015-03-12 LAB — COMPREHENSIVE METABOLIC PANEL
ALT: 58 U/L — ABNORMAL HIGH (ref 14–54)
ANION GAP: 8 (ref 5–15)
AST: 109 U/L — ABNORMAL HIGH (ref 15–41)
Albumin: 3.1 g/dL — ABNORMAL LOW (ref 3.5–5.0)
Alkaline Phosphatase: 40 U/L (ref 38–126)
BUN: <5 mg/dL — ABNORMAL LOW (ref 6–20)
CALCIUM: 8.2 mg/dL — AB (ref 8.9–10.3)
CHLORIDE: 109 mmol/L (ref 101–111)
CO2: 23 mmol/L (ref 22–32)
Creatinine, Ser: 0.65 mg/dL (ref 0.44–1.00)
GFR calc non Af Amer: >60 mL/min (ref >60)
Glucose, Bld: 97 mg/dL (ref 65–99)
Potassium: 3.7 mmol/L (ref 3.5–5.1)
SODIUM: 140 mmol/L (ref 135–145)
Total Bilirubin: 0.6 mg/dL (ref 0.3–1.2)
Total Protein: 5.8 g/dL — ABNORMAL LOW (ref 6.5–8.1)

## 2015-03-12 LAB — URINE CULTURE

## 2015-03-12 LAB — CBC
HCT: 32.3 % — ABNORMAL LOW (ref 36.0–46.0)
HEMOGLOBIN: 10.9 g/dL — AB (ref 12.0–15.0)
MCH: 27.4 pg (ref 26.0–34.0)
MCHC: 33.7 g/dL (ref 30.0–36.0)
MCV: 81.2 fL (ref 78.0–100.0)
Platelets: 160 10*3/uL (ref 150–400)
RBC: 3.98 MIL/uL (ref 3.87–5.11)
RDW: 14.4 % (ref 11.5–15.5)
WBC: 9.5 10*3/uL (ref 4.0–10.5)

## 2015-03-12 LAB — PREGNANCY, URINE: PREG TEST UR: NEGATIVE
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
## 2015-03-12 NOTE — Progress Notes (Signed)
  Echocardiogram 2D Echocardiogram has been performed.  Delcie Roch 03/12/2015, 10:20 AM
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
## 2015-03-13 ENCOUNTER — Inpatient Hospital Stay (HOSPITAL_COMMUNITY): Payer: BLUE CROSS/BLUE SHIELD

## 2015-03-13 ENCOUNTER — Other Ambulatory Visit: Payer: Self-pay | Admitting: Physician Assistant

## 2015-03-13 ENCOUNTER — Encounter (HOSPITAL_COMMUNITY): Payer: Self-pay | Admitting: General Practice

## 2015-03-13 DIAGNOSIS — R55 Syncope and collapse: Secondary | ICD-10-CM

## 2015-03-13 DIAGNOSIS — R7989 Other specified abnormal findings of blood chemistry: Secondary | ICD-10-CM | POA: Insufficient documentation

## 2015-03-13 DIAGNOSIS — T796XXA Traumatic ischemia of muscle, initial encounter: Secondary | ICD-10-CM | POA: Insufficient documentation

## 2015-03-13 DIAGNOSIS — R945 Abnormal results of liver function studies: Secondary | ICD-10-CM

## 2015-03-13 LAB — COMPREHENSIVE METABOLIC PANEL
ALK PHOS: 42 U/L (ref 38–126)
ALT: 51 U/L (ref 14–54)
AST: 74 U/L — AB (ref 15–41)
Albumin: 2.9 g/dL — ABNORMAL LOW (ref 3.5–5.0)
Anion gap: 6 (ref 5–15)
BILIRUBIN TOTAL: 0.4 mg/dL (ref 0.3–1.2)
CHLORIDE: 104 mmol/L (ref 101–111)
CO2: 26 mmol/L (ref 22–32)
CREATININE: 0.73 mg/dL (ref 0.44–1.00)
Calcium: 8.2 mg/dL — ABNORMAL LOW (ref 8.9–10.3)
GFR calc Af Amer: >60 mL/min (ref >60)
Glucose, Bld: 93 mg/dL (ref 65–99)
Potassium: 3.6 mmol/L (ref 3.5–5.1)
Sodium: 136 mmol/L (ref 135–145)
Total Protein: 5.5 g/dL — ABNORMAL LOW (ref 6.5–8.1)

## 2015-03-13 LAB — CBC
HEMATOCRIT: 32.1 % — AB (ref 36.0–46.0)
HEMOGLOBIN: 10.4 g/dL — AB (ref 12.0–15.0)
MCH: 26.5 pg (ref 26.0–34.0)
MCHC: 32.4 g/dL (ref 30.0–36.0)
MCV: 81.7 fL (ref 78.0–100.0)
Platelets: 159 10*3/uL (ref 150–400)
RBC: 3.93 MIL/uL (ref 3.87–5.11)
RDW: 14.1 % (ref 11.5–15.5)
WBC: 8.5 10*3/uL (ref 4.0–10.5)

## 2015-03-13 LAB — MAGNESIUM: Magnesium: 1.8 mg/dL (ref 1.7–2.4)

## 2015-03-13 LAB — HIV ANTIBODY (ROUTINE TESTING W REFLEX): HIV SCREEN 4TH GENERATION: NONREACTIVE

## 2015-03-13 LAB — CK: CK TOTAL: 2067 U/L — AB (ref 38–234)

## 2015-03-13 MED ORDER — OXYCODONE HCL 5 MG PO TABS: 5.0000 mg | ORAL_TABLET | ORAL | Status: DC | PRN

## 2015-03-13 NOTE — Progress Notes (Signed)
OT Cancellation Note  Patient Details Name: Ashley Pham MRN: [id number] DOB: 08-28-1980   Cancelled Treatment:    Reason Eval/Treat Not Completed: Patient at procedure or test/ unavailable (EEG). Will try back.  Evern Bio 03/13/2015, 10:54 AM

## 2015-03-13 NOTE — Progress Notes (Signed)
Discussed with the patient and all questioned fully answered. She will call me if any problems arise. PCP appt scheduled via Kristi CM.   Leonidas Romberg, RN

## 2015-03-13 NOTE — Progress Notes (Signed)
EEG Completed; Results Pending  

## 2015-03-13 NOTE — Progress Notes (Signed)
OT Cancellation Note  Patient Details Name: Ashley Pham MRN: [id number] DOB: Oct 02, 1980   Cancelled Treatment:    Reason Eval/Treat Not Completed: OT screened, no needs identified, will sign off. Pt reports being able to perform self care and mobility unassisted.  Preparing to discharge. Did not proceed with evaluation.  Evern Bio 03/13/2015, 1:30 PM  [phone]
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
## 2015-03-13 NOTE — Progress Notes (Signed)
Utilization review completed.  

## 2015-03-14 LAB — HEPATITIS PANEL, ACUTE
HEP B C IGM: NEGATIVE
HEP B S AG: NEGATIVE
Hep A IgM: NEGATIVE

## 2015-03-16 ENCOUNTER — Ambulatory Visit (INDEPENDENT_AMBULATORY_CARE_PROVIDER_SITE_OTHER): Payer: BLUE CROSS/BLUE SHIELD

## 2015-03-16 DIAGNOSIS — R55 Syncope and collapse: Secondary | ICD-10-CM | POA: Diagnosis not present

## 2015-03-23 ENCOUNTER — Other Ambulatory Visit: Payer: Self-pay | Admitting: Internal Medicine

## 2015-03-23 DIAGNOSIS — R221 Localized swelling, mass and lump, neck: Secondary | ICD-10-CM

## 2015-03-27 ENCOUNTER — Other Ambulatory Visit: Payer: BLUE CROSS/BLUE SHIELD

## 2015-03-30 ENCOUNTER — Ambulatory Visit
Admission: RE | Admit: 2015-03-30 | Discharge: 2015-03-30 | Disposition: A | Discharge status: Home / Self Care | Payer: BLUE CROSS/BLUE SHIELD | Source: Ambulatory Visit | Attending: Internal Medicine | Admitting: Internal Medicine

## 2015-03-30 DIAGNOSIS — R221 Localized swelling, mass and lump, neck: Secondary | ICD-10-CM

## 2015-04-21 ENCOUNTER — Encounter: Payer: Self-pay | Admitting: Cardiology

## 2015-04-21 ENCOUNTER — Ambulatory Visit (INDEPENDENT_AMBULATORY_CARE_PROVIDER_SITE_OTHER): Payer: BLUE CROSS/BLUE SHIELD | Admitting: Cardiology

## 2015-04-21 VITALS — BP 124/76 | HR 72 | Ht 63.0 in | Wt 232.0 lb

## 2015-04-21 DIAGNOSIS — R55 Syncope and collapse: Secondary | ICD-10-CM | POA: Diagnosis not present

## 2015-04-21 DIAGNOSIS — G4719 Other hypersomnia: Secondary | ICD-10-CM | POA: Diagnosis not present

## 2015-04-21 DIAGNOSIS — R7989 Other specified abnormal findings of blood chemistry: Secondary | ICD-10-CM | POA: Diagnosis not present

## 2015-04-21 DIAGNOSIS — R778 Other specified abnormalities of plasma proteins: Secondary | ICD-10-CM

## 2015-04-21 HISTORY — DX: Other hypersomnia: G47.19

## 2015-04-21 NOTE — Patient Instructions (Signed)
Medication Instructions:  None  Labwork: None  Testing/Procedures: Your physician has requested that you have an exercise tolerance test. For further information please visit [email or web address] Please also follow instruction sheet, as given.  Your physician has recommended that you have a sleep study. This test records several body functions during sleep, including: brain activity, eye movement, oxygen and carbon dioxide blood levels, heart rate and rhythm, breathing rate and rhythm, the flow of air through your mouth and nose, snoring, body muscle movements, and chest and belly movement.    Follow-Up: Your physician recommends that you schedule a follow-up appointment as needed with Dr. Mayford Knifeurner.   Any Other Special Instructions Will Be Listed Below (If Applicable).     If you need a refill on your cardiac medications before your next appointment, please call your pharmacy.
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
## 2015-05-02 ENCOUNTER — Ambulatory Visit (INDEPENDENT_AMBULATORY_CARE_PROVIDER_SITE_OTHER): Payer: BLUE CROSS/BLUE SHIELD

## 2015-05-02 DIAGNOSIS — R7989 Other specified abnormal findings of blood chemistry: Secondary | ICD-10-CM | POA: Diagnosis not present

## 2015-05-02 DIAGNOSIS — R55 Syncope and collapse: Secondary | ICD-10-CM | POA: Diagnosis not present

## 2015-05-02 DIAGNOSIS — R778 Other specified abnormalities of plasma proteins: Secondary | ICD-10-CM

## 2015-05-02 LAB — EXERCISE TOLERANCE TEST
CHL CUP MPHR: 188 {beats}/min
CHL CUP RESTING HR STRESS: 80 {beats}/min
CHL RATE OF PERCEIVED EXERTION: 17
CSEPED: 7 min
CSEPEDS: 30 s
Estimated workload: 9.1 METS
Peak HR: 169 {beats}/min
Percent HR: 89 %

## 2015-06-19 ENCOUNTER — Ambulatory Visit (HOSPITAL_BASED_OUTPATIENT_CLINIC_OR_DEPARTMENT_OTHER): Payer: BLUE CROSS/BLUE SHIELD | Attending: Cardiology | Admitting: Cardiology

## 2015-06-19 DIAGNOSIS — G4719 Other hypersomnia: Secondary | ICD-10-CM | POA: Insufficient documentation

## 2015-06-19 DIAGNOSIS — R0683 Snoring: Secondary | ICD-10-CM | POA: Diagnosis not present

## 2015-06-19 DIAGNOSIS — Z79899 Other long term (current) drug therapy: Secondary | ICD-10-CM | POA: Insufficient documentation

## 2015-06-19 HISTORY — PX: SPLIT NIGHT STUDY: SLE1000

## 2015-06-26 ENCOUNTER — Encounter (HOSPITAL_BASED_OUTPATIENT_CLINIC_OR_DEPARTMENT_OTHER): Payer: Self-pay | Admitting: Cardiology

## 2015-06-26 ENCOUNTER — Telehealth: Payer: Self-pay | Admitting: Cardiology

## 2015-06-26 NOTE — Procedures (Signed)
   Patient Name: Ashley Pham, Ashley Pham MRN: [id number] Study Date: 06/19/2015 Gender: Female D.O.B: 1980/07/11 Age (years): [age] Referring Provider: Armanda Magicraci Terrez Ander MD, ABSM Interpreting Physician: Armanda Magicraci Narely Nobles MD, ABSM RPSGT: Melburn PopperWillard, Susan  Weight (lbs): 232 BMI: 41 Height (inches): 63 Neck Size: 15.00  CLINICAL INFORMATION Sleep Study Type: NPSG Indication for sleep study: Excessive Daytime Sleepiness Epworth Sleepiness Score: 2  SLEEP STUDY TECHNIQUE As per the AASM Manual for the Scoring of Sleep and Associated Events v2.3 (April 2016) with a hypopnea requiring 4% desaturations. The channels recorded and monitored were frontal, central and occipital EEG, electrooculogram (EOG), submentalis EMG (chin), nasal and oral airflow, thoracic and abdominal wall motion, anterior tibialis EMG, snore microphone, electrocardiogram, and pulse oximetry.  MEDICATIONS Patient's medications include: VERAPAMIL, SERTRALINE, IBUPROFEN. Medications self-administered by patient during sleep study : No sleep medicine administered.  SLEEP ARCHITECTURE The study was initiated at 10:08:52 PM and ended at 4:35:26 AM. Sleep onset time was 14.9 minutes and the sleep efficiency was 85.4%. The total sleep time was 330.1 minutes. Stage REM latency was prolonged at 192.5 minutes. The patient spent 8.63% of the night in stage N1 sleep, 76.37% in stage N2 sleep, 0.00% in stage N3 and 14.99% in REM. Alpha intrusion was absent. Supine sleep was 52.77%.  RESPIRATORY PARAMETERS The overall apnea/hypopnea index (AHI) was 1.3 per hour. There were 3 total apneas, including 2 obstructive, 0 central and 1 mixed apneas. There were 4 hypopneas and 0 RERAs. The AHI during Stage REM sleep was 7.3 per hour. AHI while supine was 1.0 per hour. The mean oxygen saturation was 95.71%. The minimum SpO2 during sleep was 89.00%. Moderate snoring was noted during this study.  CARDIAC DATA The 2 lead EKG demonstrated sinus rhythm. The  mean heart rate was N/A beats per minute. Other EKG findings include: None  LEG MOVEMENT DATA The total PLMS were 25 with a resulting PLMS index of 4.54. Associated arousal with leg movement index was 0.0 .  IMPRESSIONS - No significant obstructive sleep apnea occurred during this study (AHI = 1.3/h). - No significant central sleep apnea occurred during this study (CAI = 0.0/h). - Oxygen desaturations were noted during this study (Min O2 = 89.00%). - The patient snored with Moderate snoring volume. - Clinically significant periodic limb movements did not occur during sleep. No significant associated arousals. - Abnormal sleep architecture with prolonged onset to REM sleep.  DIAGNOSIS - Snoring  RECOMMENDATIONS - Avoid alcohol, sedatives and other CNS depressants that may result in sleep apnea and disrupt normal sleep architecture. - Sleep hygiene should be reviewed to assess factors that may improve sleep quality. - Weight management and regular exercise should be initiated or continued if appropriate. - Patient has prolonged onset to REM sleep due to SSRI therapy.   Armanda Magicraci Annarose Ouellet Diplomate, American Board of Sleep Medicine  ELECTRONICALLY SIGNED ON:  06/26/2015, 1:05 PM [facility] PH: [phone]   FX: [phone] ACCREDITED BY THE AMERICAN ACADEMY OF SLEEP MEDICINE

## 2015-06-26 NOTE — Telephone Encounter (Signed)
Please let patient know that sleep study showed no significant sleep apnea.    

## 2015-06-29 NOTE — Telephone Encounter (Signed)
Patient informed of information.  Stated verbal understanding  

## 2015-09-15 ENCOUNTER — Encounter (HOSPITAL_COMMUNITY): Payer: Self-pay | Admitting: Family Medicine

## 2015-09-15 ENCOUNTER — Ambulatory Visit (HOSPITAL_COMMUNITY)
Admission: EM | Admit: 2015-09-15 | Discharge: 2015-09-15 | Disposition: A | Discharge status: Home / Self Care | Payer: BLUE CROSS/BLUE SHIELD | Attending: Physician Assistant | Admitting: Physician Assistant

## 2015-09-15 DIAGNOSIS — J0100 Acute maxillary sinusitis, unspecified: Secondary | ICD-10-CM

## 2015-09-15 MED ORDER — AMOXICILLIN 500 MG PO CAPS: 500.0000 mg | ORAL_CAPSULE | Freq: Two times a day (BID) | ORAL | 0 refills | Status: DC

## 2015-09-15 NOTE — Discharge Instructions (Signed)
You most likely have a sinus infection. Treat with OTC steroid sprays, nasal rinses are great too. Continue Mucinex with 4 OZ water and complete your antibiotics. FU as needed. Feel better.

## 2015-09-15 NOTE — ED Triage Notes (Signed)
Pt here for nasal congestion, cough, fullness in her ears. sts she has been sneezing out mucous and having facial pain and pressure

## 2015-09-15 NOTE — ED Provider Notes (Signed)
CSN: [id number]     Arrival date & time 09/15/15  1325 History   First MD Initiated Contact with Patient 09/15/15 1416     Chief Complaint  Patient presents with  . Facial Pain  . Nasal Congestion   (Consider location/radiation/quality/duration/timing/severity/associated sxs/prior Treatment) Patient presents with a 7 day history of nasal congestion, malaise and productive (green) cough. No fever or chills.       Past Medical History:  Diagnosis Date  . Anxiety   . Depression   . Elevated troponin 03/11/2015  . Excessive daytime sleepiness 04/21/2015  . Hypertension   . LFT elevation   . Morbid obesity (HCC)   . Syncope and collapse 03/11/2015  . Traumatic rhabdomyolysis [facility](HCC)    Past Surgical History:  Procedure Laterality Date  . SPLIT NIGHT STUDY  06/19/2015  . sweat gland removal    . WISDOM TOOTH EXTRACTION     Family History  Problem Relation Age of Onset  . Heart murmur Mother   . Hypertension Father   . Heart disease Father   . Diabetes Father    Social History  Substance Use Topics  . Smoking status: Former Smoker    Packs/day: 0.50    Types: Cigarettes  . Smokeless tobacco: Current User  . Alcohol use No   OB History    No data available     Review of Systems  Constitutional: Negative for chills and fever.  HENT: Positive for congestion and sinus pressure. Negative for postnasal drip and rhinorrhea.   Eyes: Negative.   Respiratory: Positive for cough. Negative for shortness of breath.   Musculoskeletal: Negative.   Skin: Negative.   Psychiatric/Behavioral: Negative.     Allergies  Review of patient's allergies indicates no known allergies.  Home Medications   Prior to Admission medications   Medication Sig Start Date End Date Taking? Authorizing Provider  amoxicillin (AMOXIL) 500 MG capsule Take 1 capsule (500 mg total) by mouth 2 (two) times daily. 09/15/15   Riki SheerMichelle G Raheem Kolbe, PA-C  methocarbamol (ROBAXIN) 500 MG tablet Take 1 tablet by mouth as  directed. 03/31/15   Historical Provider, MD  sertraline (ZOLOFT) 100 MG tablet Take 150 mg by mouth daily. 08/29/14   Historical Provider, MD  verapamil (VERELAN PM) 120 MG 24 hr capsule Take 1 capsule by mouth daily. 07/27/14   Historical Provider, MD   Meds Ordered and Administered this Visit  Medications - No data to display  BP 128/71 (BP Location: Right Arm)   Pulse 88   Temp 98 F (36.7 C) (Oral)   Resp 12  No data found.   Physical Exam  Constitutional: She appears well-developed and well-nourished. No distress.  HENT:  Head: Normocephalic and atraumatic.  Mouth/Throat: Oropharynx is clear and moist.  Swollen and erythematous turbinates. Clear drainage, Ears with retracted TM  Cardiovascular: Normal rate and regular rhythm.   Pulmonary/Chest: Effort normal and breath sounds normal.  Lymphadenopathy:    She has no cervical adenopathy.  Skin: Skin is warm and dry. She is not diaphoretic.  Psychiatric: Her behavior is normal.  Nursing note and vitals reviewed.   Urgent Care Course   Clinical Course    Procedures (including critical care time)  Labs Review Labs Reviewed - No data to display  Imaging Review No results found.   Visual Acuity Review  Right Eye Distance:   Left Eye Distance:   Bilateral Distance:    Right Eye Near:   Left Eye Near:    Bilateral  Near:         MDM   1. Acute non-recurrent maxillary sinusitis    Treat with Amox, fluids and OTC supportive care. Avoid decongestants in the setting of HTN.     Riki Sheer, PA-C 09/15/15 1431

## 2016-02-12 ENCOUNTER — Encounter (HOSPITAL_COMMUNITY): Payer: Self-pay | Admitting: Emergency Medicine

## 2016-02-12 ENCOUNTER — Ambulatory Visit (HOSPITAL_COMMUNITY)
Admission: EM | Admit: 2016-02-12 | Discharge: 2016-02-12 | Disposition: A | Discharge status: Home / Self Care | Payer: BLUE CROSS/BLUE SHIELD | Attending: Emergency Medicine | Admitting: Emergency Medicine

## 2016-02-12 DIAGNOSIS — H6122 Impacted cerumen, left ear: Secondary | ICD-10-CM | POA: Diagnosis not present

## 2016-02-12 DIAGNOSIS — H8302 Labyrinthitis, left ear: Secondary | ICD-10-CM

## 2016-02-12 MED ORDER — MECLIZINE HCL 12.5 MG PO TABS: 12.5000 mg | ORAL_TABLET | Freq: Three times a day (TID) | ORAL | 0 refills | Status: DC | PRN

## 2016-02-12 MED ORDER — ONDANSETRON 4 MG PO TBDP: 4.0000 mg | ORAL_TABLET | Freq: Three times a day (TID) | ORAL | 0 refills | Status: DC | PRN

## 2016-02-12 MED ORDER — FLUTICASONE PROPIONATE 50 MCG/ACT NA SUSP: 2.0000 | Freq: Every day | NASAL | 2 refills | Status: DC

## 2016-02-12 MED ORDER — ONDANSETRON 4 MG PO TBDP
4.0000 mg | ORAL_TABLET | Freq: Once | ORAL | Status: AC
  Administered 2016-02-12: 4 mg via ORAL

## 2016-02-12 MED ORDER — ONDANSETRON 4 MG PO TBDP
ORAL_TABLET | ORAL | Status: AC
  Filled 2016-02-12: qty 1

## 2016-02-12 NOTE — Discharge Instructions (Signed)
I believe you have a condition called labyrinthitis and this is causing your nausea and dizziness. The cerumen impaction in your ear has been removed and I have sent three prescriptions to your pharmacy. For dizziness I have prescribed Antivert, take 1 tablet up to three times a day for dizziness. This medicine may cause drowsiness. Do not drink or drive while taking. For your nausea, I have prescribed Zofran, place 1 tablet under the tongue every 8 hours as needed for nausea. For your congestion and to facilitate drainage of your ears. I have prescribed a nasal steroid called Flonase, do two sprays in each nostril once a day. Should your symptoms fail to resolve follow up with your primary care provider or return to clinic as needed.

## 2016-02-12 NOTE — ED Provider Notes (Signed)
CSN: [id number]     Arrival date & time 02/12/16  1129 History   None    Chief Complaint  Patient presents with  . Nausea   (Consider location/radiation/quality/duration/timing/severity/associated sxs/prior Treatment) 36 year old female presents to clinic with chief complaint of dizziness, nausea, and vomiting for 1 month. She reports she has been congested and there has been pressure in the left ear. She has been eating and she has been drinking normally, The dizziness is worse with change of position, and feels as if the room is spinning    The history is provided by the patient.    Past Medical History:  Diagnosis Date  . Anxiety   . Depression   . Elevated troponin 03/11/2015  . Excessive daytime sleepiness 04/21/2015  . Hypertension   . LFT elevation   . Morbid obesity (HCC)   . Syncope and collapse 03/11/2015  . Traumatic rhabdomyolysis [facility](HCC)    Past Surgical History:  Procedure Laterality Date  . SPLIT NIGHT STUDY  06/19/2015  . sweat gland removal    . WISDOM TOOTH EXTRACTION     Family History  Problem Relation Age of Onset  . Heart murmur Mother   . Hypertension Father   . Heart disease Father   . Diabetes Father    Social History  Substance Use Topics  . Smoking status: Former Smoker    Packs/day: 0.50    Types: Cigarettes  . Smokeless tobacco: Current User  . Alcohol use No   OB History    No data available     Review of Systems  Reason unable to perform ROS: As covered in HPI.  All other systems reviewed and are negative.   Allergies  Patient has no known allergies.  Home Medications   Prior to Admission medications   Medication Sig Start Date End Date Taking? Authorizing Provider  sertraline (ZOLOFT) 100 MG tablet Take 150 mg by mouth daily. 08/29/14  Yes Historical Provider, MD  fluticasone (FLONASE) 50 MCG/ACT nasal spray Place 2 sprays into both nostrils daily. 02/12/16   Dorena BodoLawrence Cedric Mcclaine, NP  meclizine (ANTIVERT) 12.5 MG tablet Take 1 tablet  (12.5 mg total) by mouth 3 (three) times daily as needed for dizziness. 02/12/16   Dorena BodoLawrence Annaleise Burger, NP  ondansetron (ZOFRAN ODT) 4 MG disintegrating tablet Take 1 tablet (4 mg total) by mouth every 8 (eight) hours as needed for nausea or vomiting. 02/12/16   Dorena BodoLawrence Doryce Mcgregory, NP   Meds Ordered and Administered this Visit   Medications  ondansetron (ZOFRAN-ODT) disintegrating tablet 4 mg (4 mg Oral Given 02/12/16 1345)    BP 127/61 (BP Location: Right Arm)   Pulse 77   Temp 98.1 F (36.7 C) (Oral)   Resp 18   SpO2 100%  No data found.   Physical Exam  Constitutional: She is oriented to person, place, and time. She appears well-developed and well-nourished. No distress.  HENT:  Head: Normocephalic and atraumatic.  Right Ear: Tympanic membrane and external ear normal.  Left Ear: External ear normal.  Mouth/Throat: Oropharynx is clear and moist.  Left cerumen impaction.  Eyes: EOM are normal. Pupils are equal, round, and reactive to light.  Neck: Normal range of motion. Neck supple. No JVD present.  Cardiovascular: Normal rate and regular rhythm.   Pulmonary/Chest: Effort normal and breath sounds normal.  Abdominal: Soft. Bowel sounds are normal.  Neurological: She is alert and oriented to person, place, and time.  Skin: Skin is warm and dry. Capillary refill takes less  than 2 seconds. She is not diaphoretic.  Psychiatric: She has a normal mood and affect.  Nursing note and vitals reviewed.   Urgent Care Course     Procedures (including critical care time)  Labs Review Labs Reviewed - No data to display  Imaging Review No results found.   Visual Acuity Review  Right Eye Distance:   Left Eye Distance:   Bilateral Distance:    Right Eye Near:   Left Eye Near:    Bilateral Near:         MDM   1. Labyrinthitis of left ear   2. Impacted cerumen of left ear   I believe you have a condition called labyrinthitis and this is causing your nausea and dizziness. The  cerumen impaction in your ear has been removed and I have sent three prescriptions to your pharmacy. For dizziness I have prescribed Antivert, take 1 tablet up to three times a day for dizziness. This medicine may cause drowsiness. Do not drink or drive while taking. For your nausea, I have prescribed Zofran, place 1 tablet under the tongue every 8 hours as needed for nausea. For your congestion and to facilitate drainage of your ears. I have prescribed a nasal steroid called Flonase, do two sprays in each nostril once a day. Should your symptoms fail to resolve follow up with your primary care provider or return to clinic as needed.      Dorena Bodo, NP 02/12/16 1409

## 2016-02-12 NOTE — ED Triage Notes (Signed)
[facility] a [facility] of N/V/D with some dizziness x 1 month.

## 2016-06-03 ENCOUNTER — Ambulatory Visit
Admission: RE | Admit: 2016-06-03 | Discharge: 2016-06-03 | Disposition: A | Discharge status: Home / Self Care | Payer: BLUE CROSS/BLUE SHIELD | Source: Ambulatory Visit | Attending: Internal Medicine | Admitting: Internal Medicine

## 2016-06-03 ENCOUNTER — Other Ambulatory Visit: Payer: Self-pay | Admitting: Internal Medicine

## 2016-06-03 DIAGNOSIS — M542 Cervicalgia: Secondary | ICD-10-CM

## 2016-06-05 LAB — GLUCOSE, POCT (MANUAL RESULT ENTRY): POC GLUCOSE: 71 mg/dL (ref 70–99)
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
## 2017-04-08 ENCOUNTER — Ambulatory Visit: Payer: Self-pay | Admitting: Sports Medicine

## 2017-04-22 ENCOUNTER — Encounter: Payer: Self-pay | Admitting: Sports Medicine

## 2017-04-22 ENCOUNTER — Ambulatory Visit (INDEPENDENT_AMBULATORY_CARE_PROVIDER_SITE_OTHER): Payer: Managed Care, Other (non HMO)

## 2017-04-22 ENCOUNTER — Ambulatory Visit: Payer: Managed Care, Other (non HMO) | Admitting: Sports Medicine

## 2017-04-22 ENCOUNTER — Other Ambulatory Visit: Payer: Self-pay | Admitting: Sports Medicine

## 2017-04-22 VITALS — BP 130/86 | HR 83 | Resp 16

## 2017-04-22 DIAGNOSIS — M722 Plantar fascial fibromatosis: Secondary | ICD-10-CM

## 2017-04-22 DIAGNOSIS — M79672 Pain in left foot: Secondary | ICD-10-CM

## 2017-04-22 MED ORDER — TRIAMCINOLONE ACETONIDE 10 MG/ML IJ SUSP: 10.0000 mg | Freq: Once | INTRAMUSCULAR | Status: DC

## 2017-04-22 NOTE — Patient Instructions (Signed)
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
## 2017-05-20 ENCOUNTER — Encounter: Payer: Self-pay | Admitting: Sports Medicine

## 2017-05-20 ENCOUNTER — Telehealth: Payer: Self-pay | Admitting: Sports Medicine

## 2017-05-20 ENCOUNTER — Ambulatory Visit: Payer: Managed Care, Other (non HMO) | Admitting: Sports Medicine

## 2017-05-20 DIAGNOSIS — M79672 Pain in left foot: Secondary | ICD-10-CM

## 2017-05-20 DIAGNOSIS — M722 Plantar fascial fibromatosis: Secondary | ICD-10-CM | POA: Diagnosis not present

## 2017-05-20 MED ORDER — MELOXICAM 15 MG PO TABS: 15.0000 mg | ORAL_TABLET | Freq: Every day | ORAL | 0 refills | Status: DC

## 2017-05-20 MED ORDER — METHYLPREDNISOLONE 4 MG PO TBPK: ORAL_TABLET | ORAL | 0 refills | Status: DC
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
## 2017-05-20 NOTE — Telephone Encounter (Signed)
I saw Dr. Marylene Land today and she said she was going to put me on steroids for 6 days and then something else after that. I don't see where that was sent to the pharmacy and also when did she want me to start taking it? My phone number is [phone]. Thank you. Bye bye.

## 2017-05-20 NOTE — Addendum Note (Signed)
Addended by: Alphia Kava D on: 05/20/2017 05:31 PM   Modules accepted: Orders

## 2017-06-24 ENCOUNTER — Ambulatory Visit (INDEPENDENT_AMBULATORY_CARE_PROVIDER_SITE_OTHER): Payer: Managed Care, Other (non HMO) | Admitting: Sports Medicine

## 2017-06-24 ENCOUNTER — Encounter: Payer: Self-pay | Admitting: Sports Medicine

## 2017-06-24 ENCOUNTER — Ambulatory Visit (INDEPENDENT_AMBULATORY_CARE_PROVIDER_SITE_OTHER): Payer: Managed Care, Other (non HMO)

## 2017-06-24 DIAGNOSIS — M722 Plantar fascial fibromatosis: Secondary | ICD-10-CM

## 2017-06-24 DIAGNOSIS — M2041 Other hammer toe(s) (acquired), right foot: Secondary | ICD-10-CM

## 2017-06-24 DIAGNOSIS — M2042 Other hammer toe(s) (acquired), left foot: Secondary | ICD-10-CM

## 2017-06-24 DIAGNOSIS — M79672 Pain in left foot: Secondary | ICD-10-CM
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
## 2017-06-24 NOTE — Patient Instructions (Signed)
Pre-Operative Instructions  Congratulations, you have decided to take an important step towards improving your quality of life.  You can be assured that the doctors and staff at [facility] will be with you every step of the way.  Here are some important things you should know:  1. Plan to be at the surgery center/hospital at least 1 (one) hour prior to your scheduled time, unless otherwise directed by the surgical center/hospital staff.  You must have a responsible adult accompany you, remain during the surgery and drive you home.  Make sure you have directions to the surgical center/hospital to ensure you arrive on time. 2. If you are having surgery at [facility] or [facility] hospitals, you will need a copy of your medical history and physical form from your family physician within one month prior to the date of surgery. We will give you a form for your primary physician to complete.  3. We make every effort to accommodate the date you request for surgery.  However, there are times where surgery dates or times have to be moved.  We will contact you as soon as possible if a change in schedule is required.   4. No aspirin/ibuprofen for one week before surgery.  If you are on aspirin, any non-steroidal anti-inflammatory medications (Mobic, Aleve, Ibuprofen) should not be taken seven (7) days prior to your surgery.  You make take Tylenol for pain prior to surgery.  5. Medications - If you are taking daily heart and blood pressure medications, seizure, reflux, allergy, asthma, anxiety, pain or diabetes medications, make sure you notify the surgery center/hospital before the day of surgery so they can tell you which medications you should take or avoid the day of surgery. 6. No food or drink after midnight the night before surgery unless directed otherwise by surgical center/hospital staff. 7. No alcoholic beverages 24-hours prior to surgery.  No smoking 24-hours prior or 24-hours after  surgery. 8. Wear loose pants or shorts. They should be loose enough to fit over bandages, boots, and casts. 9. Don't wear slip-on shoes. Sneakers are preferred. 10. Bring your boot with you to the surgery center/hospital.  Also bring crutches or a walker if your physician has prescribed it for you.  If you do not have this equipment, it will be provided for you after surgery. 11. If you have not been contacted by the surgery center/hospital by the day before your surgery, call to confirm the date and time of your surgery. 12. Leave-time from work may vary depending on the type of surgery you have.  Appropriate arrangements should be made prior to surgery with your employer. 13. Prescriptions will be provided immediately following surgery by your doctor.  Fill these as soon as possible after surgery and take the medication as directed. Pain medications will not be refilled on weekends and must be approved by the doctor. 14. Remove nail polish on the operative foot and avoid getting pedicures prior to surgery. 15. Wash the night before surgery.  The night before surgery wash the foot and leg well with water and the antibacterial soap provided. Be sure to pay special attention to beneath the toenails and in between the toes.  Wash for at least three (3) minutes. Rinse thoroughly with water and dry well with a towel.  Perform this wash unless told not to do so by your physician.  Enclosed: 1 Ice pack (please put in freezer the night before surgery)   1 Hibiclens skin cleaner     Pre-op instructions  If you have any questions regarding the instructions, please do not hesitate to call our office.  [place]: [address], [place], [place] [address] -- [phone]  [place]: [address] [place], [place] [address] -- [phone]  [place]: [address]  [place], [place] [address] -- [phone]  [place]: [address], [address], [place], [place] [address] -- [phone]  Website:  [email or web address] 

## 2017-06-26 ENCOUNTER — Telehealth: Payer: Self-pay | Admitting: *Deleted

## 2017-06-26 NOTE — Telephone Encounter (Signed)
I'm returning your call.  You want to schedule surgery with Dr. Marylene LandStover?  "Yes, I do."  Dr. Marylene LandStover does surgery on Mondays. Do you have a date in mind?  "I'd like to do it on July 1."  That date is available.  I'll get it scheduled.  Someone from the surgical center will call you the Friday before, they will give you your arrival time.  You need to go on-line and register with the surgical center.  The instructions are in the brochure that we gave you.  "Okay, thank you."

## 2017-06-26 NOTE — Telephone Encounter (Signed)
"  I was calling to schedule a surgery with Dr. Marylene LandStover.  If you could, just give me a call back, that will be greatly appreciated."

## 2017-07-08 ENCOUNTER — Telehealth: Payer: Self-pay | Admitting: *Deleted

## 2017-07-08 NOTE — Telephone Encounter (Signed)
[name], Adminastrative Assistant, informed me that Ashley Jacksonori called and stated she was canceling her surgery scheduled for July 1 due to money issues.  Aram BeechamCynthia from the surgical center also informed me of the cancellation.  She stated the patient said he could only put $50 towards her surgery.

## 2017-07-08 NOTE — Telephone Encounter (Signed)
Post op visits have been cancelled. °

## 2017-07-22 ENCOUNTER — Encounter: Payer: Managed Care, Other (non HMO) | Admitting: Sports Medicine

## 2017-07-29 ENCOUNTER — Encounter: Payer: Managed Care, Other (non HMO) | Admitting: Sports Medicine

## 2017-09-23 ENCOUNTER — Encounter: Payer: Self-pay | Admitting: Sports Medicine

## 2017-09-23 ENCOUNTER — Ambulatory Visit: Payer: Managed Care, Other (non HMO) | Admitting: Sports Medicine

## 2017-09-23 ENCOUNTER — Other Ambulatory Visit: Payer: Managed Care, Other (non HMO)

## 2017-09-23 DIAGNOSIS — M722 Plantar fascial fibromatosis: Secondary | ICD-10-CM | POA: Diagnosis not present

## 2017-09-23 DIAGNOSIS — M79672 Pain in left foot: Secondary | ICD-10-CM

## 2017-09-23 DIAGNOSIS — M79671 Pain in right foot: Secondary | ICD-10-CM

## 2017-09-23 MED ORDER — METHYLPREDNISOLONE 4 MG PO TBPK: ORAL_TABLET | ORAL | 0 refills | Status: DC

## 2017-09-23 MED ORDER — TRIAMCINOLONE ACETONIDE 10 MG/ML IJ SUSP: 10.0000 mg | Freq: Once | INTRAMUSCULAR | Status: DC

## 2017-09-23 MED ORDER — MELOXICAM 15 MG PO TABS: 15.0000 mg | ORAL_TABLET | Freq: Every day | ORAL | 0 refills | Status: DC
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
## 2017-10-21 ENCOUNTER — Ambulatory Visit: Payer: Managed Care, Other (non HMO) | Admitting: Sports Medicine

## 2017-12-16 ENCOUNTER — Encounter

## 2017-12-16 ENCOUNTER — Encounter: Payer: Self-pay | Admitting: Sports Medicine

## 2017-12-16 ENCOUNTER — Ambulatory Visit (INDEPENDENT_AMBULATORY_CARE_PROVIDER_SITE_OTHER): Payer: Managed Care, Other (non HMO) | Admitting: Sports Medicine

## 2017-12-16 DIAGNOSIS — M79671 Pain in right foot: Secondary | ICD-10-CM

## 2017-12-16 DIAGNOSIS — M79672 Pain in left foot: Secondary | ICD-10-CM

## 2017-12-16 DIAGNOSIS — M722 Plantar fascial fibromatosis: Secondary | ICD-10-CM | POA: Diagnosis not present

## 2017-12-16 MED ORDER — TRIAMCINOLONE ACETONIDE 10 MG/ML IJ SUSP
10.0000 mg | Freq: Once | INTRAMUSCULAR | Status: AC
  Administered 2017-12-16: 10 mg
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
## 2018-03-17 ENCOUNTER — Ambulatory Visit: Payer: Self-pay | Admitting: Sports Medicine

## 2018-03-17 ENCOUNTER — Encounter: Payer: Self-pay | Admitting: Sports Medicine

## 2018-03-17 DIAGNOSIS — M722 Plantar fascial fibromatosis: Secondary | ICD-10-CM

## 2018-03-17 NOTE — Progress Notes (Signed)
Patient left before she could be seen -Dr. Marylene Land

## 2018-03-24 ENCOUNTER — Encounter: Payer: Self-pay | Admitting: Sports Medicine

## 2018-03-24 ENCOUNTER — Ambulatory Visit (INDEPENDENT_AMBULATORY_CARE_PROVIDER_SITE_OTHER): Payer: BC Managed Care – PPO | Admitting: Sports Medicine

## 2018-03-24 DIAGNOSIS — M79671 Pain in right foot: Secondary | ICD-10-CM

## 2018-03-24 DIAGNOSIS — M2041 Other hammer toe(s) (acquired), right foot: Secondary | ICD-10-CM

## 2018-03-24 DIAGNOSIS — M722 Plantar fascial fibromatosis: Secondary | ICD-10-CM | POA: Diagnosis not present

## 2018-03-24 DIAGNOSIS — M79672 Pain in left foot: Secondary | ICD-10-CM

## 2018-03-24 DIAGNOSIS — M2042 Other hammer toe(s) (acquired), left foot: Secondary | ICD-10-CM

## 2018-03-24 MED ORDER — TRIAMCINOLONE ACETONIDE 10 MG/ML IJ SUSP
10.0000 mg | Freq: Once | INTRAMUSCULAR | Status: AC
  Administered 2018-03-24: 10 mg
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
## 2019-02-24 ENCOUNTER — Other Ambulatory Visit: Payer: BLUE CROSS/BLUE SHIELD

## 2019-03-01 ENCOUNTER — Encounter: Payer: Self-pay | Admitting: Sports Medicine

## 2020-01-15 NOTE — L&D Delivery Note (Signed)
Verified with picture in patient's phone of positive Covid test on August 26, 2020.

## 2020-03-14 LAB — OB RESULTS CONSOLE ABO/RH: RH Type: POSITIVE

## 2020-03-14 LAB — OB RESULTS CONSOLE HIV ANTIBODY (ROUTINE TESTING): HIV: NONREACTIVE

## 2020-03-14 LAB — OB RESULTS CONSOLE RUBELLA ANTIBODY, IGM: Rubella: IMMUNE

## 2020-03-14 LAB — HEPATITIS C ANTIBODY: HCV Ab: NEGATIVE

## 2020-03-14 LAB — OB RESULTS CONSOLE HEPATITIS B SURFACE ANTIGEN: Hepatitis B Surface Ag: NEGATIVE

## 2020-03-21 LAB — OB RESULTS CONSOLE GC/CHLAMYDIA
Chlamydia: NEGATIVE
Gonorrhea: NEGATIVE

## 2020-07-27 LAB — OB RESULTS CONSOLE RPR: RPR: NONREACTIVE

## 2020-09-25 LAB — OB RESULTS CONSOLE GBS: GBS: POSITIVE

## 2020-09-27 ENCOUNTER — Telehealth (HOSPITAL_COMMUNITY): Payer: Self-pay | Admitting: *Deleted

## 2020-09-27 ENCOUNTER — Encounter (HOSPITAL_COMMUNITY): Payer: Self-pay

## 2020-09-27 ENCOUNTER — Encounter (HOSPITAL_COMMUNITY): Payer: Self-pay | Admitting: *Deleted

## 2020-09-27 NOTE — Telephone Encounter (Signed)
Preadmission screen  

## 2020-10-10 ENCOUNTER — Other Ambulatory Visit: Payer: Self-pay

## 2020-10-10 ENCOUNTER — Other Ambulatory Visit: Payer: Self-pay | Admitting: Obstetrics and Gynecology

## 2020-10-10 NOTE — H&P (Signed)
Ashley Pham is a 40 y.o. female G2P1001 at 38 weeks and 1 day  presenting for induction of labor due to chronic hypertension. Pregnancy also complicated by depression well controlled with zoloft 50 mg daily  and AMA.   Prenatal care provided by Dr. Gerald Leitz with [facility].   . OB History     Gravida  2   Para  1   Term  1   Preterm      AB      Living         SAB      IAB      Ectopic      Multiple      Live Births             Past Medical History:  Diagnosis Date   Anxiety    Depression    Elevated troponin 03/11/2015   Excessive daytime sleepiness 04/21/2015   Hypertension    LFT elevation    Morbid obesity (HCC)    Syncope and collapse 03/11/2015   Traumatic rhabdomyolysis [facility])    Past Surgical History:  Procedure Laterality Date   SPLIT NIGHT STUDY  06/19/2015   sweat gland removal     WISDOM TOOTH EXTRACTION     Family History: family history includes Diabetes in her father; Heart disease in her father; Heart murmur in her mother; Hypertension in her father. Social History:  reports that she has quit smoking. Her smoking use included cigarettes. She smoked an average of .5 packs per day. She uses smokeless tobacco. She reports current drug use. Drug: Marijuana. She reports that she does not drink alcohol.     Maternal Diabetes: No Genetic Screening: Declined Maternal Ultrasounds/Referrals: Normal Fetal Ultrasounds or other Referrals:  None Maternal Substance Abuse:  No Significant Maternal Medications:  Meds include: Zoloft Protonix Other:  Significant Maternal Lab Results:  Group B Strep positive Other Comments:  None  Review of Systems  Constitutional: Negative.   HENT: Negative.    Eyes: Negative.   Respiratory: Negative.    Cardiovascular: Negative.   Gastrointestinal: Negative.   Endocrine: Negative.   Genitourinary: Negative.   Musculoskeletal: Negative.   Skin: Negative.   Allergic/Immunologic: Negative.   Neurological:  Negative.   Hematological: Negative.   Psychiatric/Behavioral: Negative.    History   There were no vitals taken for this visit. Maternal Exam:  Introitus: Normal vulva.  Physical Exam Vitals reviewed.  Constitutional:      Appearance: Normal appearance.  HENT:     Head: Normocephalic and atraumatic.     Nose: Nose normal.     Mouth/Throat:     Mouth: Mucous membranes are dry.  Eyes:     Pupils: Pupils are equal, round, and reactive to light.  Cardiovascular:     Rate and Rhythm: Normal rate and regular rhythm.     Pulses: Normal pulses.     Heart sounds: Normal heart sounds.  Pulmonary:     Effort: Pulmonary effort is normal.     Breath sounds: Normal breath sounds.  Abdominal:     Tenderness: There is no abdominal tenderness.  Genitourinary:    General: Normal vulva.  Musculoskeletal:        General: Swelling present. Normal range of motion.     Cervical back: Normal range of motion and neck supple.  Skin:    General: Skin is warm and dry.  Neurological:     General: No focal deficit present.  Mental Status: She is alert and oriented to person, place, and time.  Psychiatric:        Mood and Affect: Mood normal.        Behavior: Behavior normal.    Prenatal labs: ABO, Rh: O/Positive/-- (03/01 0000) Antibody:   Rubella:   RPR: Nonreactive (07/14 0000)  HBsAg: Negative (03/01 0000)  HIV: Non-reactive (03/01 0000)  GBS:     Assessment/Plan: 38 weeks and 1 day with CHTN affection pregnancy  - admit for induction  - cytotec for cervical ripening.  - continue procardia xl 30 mg daily  PIH labs on admission   Depression - continue zoloft 50 mg daily  GBS Positive- PCN for prophylaxis  GERD- Protonix.  Anticipate SVD.    Gerald Leitz 10/10/2020, 7:29 PM

## 2020-10-10 NOTE — H&P (Deleted)
  The note originally documented on this encounter has been moved the the encounter in which it belongs.  

## 2020-10-11 ENCOUNTER — Inpatient Hospital Stay (HOSPITAL_COMMUNITY)
Admission: AD | Admit: 2020-10-11 | Discharge: 2020-10-14 | DRG: 787 | Disposition: A | Discharge status: Home / Self Care | Payer: BC Managed Care – PPO | Attending: Obstetrics and Gynecology | Admitting: Obstetrics and Gynecology

## 2020-10-11 ENCOUNTER — Encounter (HOSPITAL_COMMUNITY)
Admission: AD | Disposition: A | Discharge status: Home / Self Care | Payer: Self-pay | Source: Home / Self Care | Attending: Obstetrics and Gynecology

## 2020-10-11 ENCOUNTER — Inpatient Hospital Stay (HOSPITAL_COMMUNITY): Payer: BC Managed Care – PPO

## 2020-10-11 ENCOUNTER — Encounter (HOSPITAL_COMMUNITY): Payer: Self-pay | Admitting: Obstetrics and Gynecology

## 2020-10-11 ENCOUNTER — Inpatient Hospital Stay (HOSPITAL_COMMUNITY): Payer: BC Managed Care – PPO | Admitting: Anesthesiology

## 2020-10-11 DIAGNOSIS — O9081 Anemia of the puerperium: Secondary | ICD-10-CM | POA: Diagnosis not present

## 2020-10-11 DIAGNOSIS — O9962 Diseases of the digestive system complicating childbirth: Secondary | ICD-10-CM | POA: Diagnosis present

## 2020-10-11 DIAGNOSIS — Z3490 Encounter for supervision of normal pregnancy, unspecified, unspecified trimester: Secondary | ICD-10-CM | POA: Diagnosis present

## 2020-10-11 DIAGNOSIS — Z23 Encounter for immunization: Secondary | ICD-10-CM | POA: Diagnosis not present

## 2020-10-11 DIAGNOSIS — O99344 Other mental disorders complicating childbirth: Secondary | ICD-10-CM | POA: Diagnosis present

## 2020-10-11 DIAGNOSIS — O1002 Pre-existing essential hypertension complicating childbirth: Secondary | ICD-10-CM | POA: Diagnosis present

## 2020-10-11 DIAGNOSIS — O10919 Unspecified pre-existing hypertension complicating pregnancy, unspecified trimester: Secondary | ICD-10-CM | POA: Diagnosis present

## 2020-10-11 DIAGNOSIS — O99214 Obesity complicating childbirth: Secondary | ICD-10-CM | POA: Diagnosis present

## 2020-10-11 DIAGNOSIS — D62 Acute posthemorrhagic anemia: Secondary | ICD-10-CM | POA: Diagnosis not present

## 2020-10-11 DIAGNOSIS — Z87891 Personal history of nicotine dependence: Secondary | ICD-10-CM

## 2020-10-11 DIAGNOSIS — K219 Gastro-esophageal reflux disease without esophagitis: Secondary | ICD-10-CM | POA: Diagnosis present

## 2020-10-11 DIAGNOSIS — Z3A38 38 weeks gestation of pregnancy: Secondary | ICD-10-CM

## 2020-10-11 DIAGNOSIS — F32A Depression, unspecified: Secondary | ICD-10-CM | POA: Diagnosis present

## 2020-10-11 DIAGNOSIS — O368390 Maternal care for abnormalities of the fetal heart rate or rhythm, unspecified trimester, not applicable or unspecified: Secondary | ICD-10-CM | POA: Diagnosis not present

## 2020-10-11 DIAGNOSIS — O99824 Streptococcus B carrier state complicating childbirth: Secondary | ICD-10-CM | POA: Diagnosis present

## 2020-10-11 DIAGNOSIS — Z79899 Other long term (current) drug therapy: Secondary | ICD-10-CM

## 2020-10-11 DIAGNOSIS — Z98891 History of uterine scar from previous surgery: Secondary | ICD-10-CM

## 2020-10-11 LAB — COMPREHENSIVE METABOLIC PANEL
ALT: 13 U/L (ref 0–44)
AST: 23 U/L (ref 15–41)
Albumin: 2.7 g/dL — ABNORMAL LOW (ref 3.5–5.0)
Alkaline Phosphatase: 154 U/L — ABNORMAL HIGH (ref 38–126)
Anion gap: 8 (ref 5–15)
BUN: 6 mg/dL (ref 6–20)
CO2: 19 mmol/L — ABNORMAL LOW (ref 22–32)
Calcium: 8.4 mg/dL — ABNORMAL LOW (ref 8.9–10.3)
Chloride: 108 mmol/L (ref 98–111)
Creatinine, Ser: 0.68 mg/dL (ref 0.44–1.00)
GFR, Estimated: >60 mL/min (ref >60)
Glucose, Bld: 111 mg/dL — ABNORMAL HIGH (ref 70–99)
Potassium: 3.5 mmol/L (ref 3.5–5.1)
Sodium: 135 mmol/L (ref 135–145)
Total Bilirubin: 0.6 mg/dL (ref 0.3–1.2)
Total Protein: 6 g/dL — ABNORMAL LOW (ref 6.5–8.1)

## 2020-10-11 LAB — CBC
HCT: 33.8 % — ABNORMAL LOW (ref 36.0–46.0)
HCT: 32.7 % — ABNORMAL LOW (ref 36.0–46.0)
Hemoglobin: 11 g/dL — ABNORMAL LOW (ref 12.0–15.0)
Hemoglobin: 10.8 g/dL — ABNORMAL LOW (ref 12.0–15.0)
MCH: 25.8 pg — ABNORMAL LOW (ref 26.0–34.0)
MCH: 25.8 pg — ABNORMAL LOW (ref 26.0–34.0)
MCHC: 32.5 g/dL (ref 30.0–36.0)
MCHC: 33 g/dL (ref 30.0–36.0)
MCV: 79.2 fL — ABNORMAL LOW (ref 80.0–100.0)
MCV: 78 fL — ABNORMAL LOW (ref 80.0–100.0)
Platelets: 234 10*3/uL (ref 150–400)
Platelets: 203 10*3/uL (ref 150–400)
RBC: 4.27 MIL/uL (ref 3.87–5.11)
RBC: 4.19 MIL/uL (ref 3.87–5.11)
RDW: 14.7 % (ref 11.5–15.5)
RDW: 14.6 % (ref 11.5–15.5)
WBC: 13.4 10*3/uL — ABNORMAL HIGH (ref 4.0–10.5)
WBC: 11.6 10*3/uL — ABNORMAL HIGH (ref 4.0–10.5)
nRBC: 0 % (ref 0.0–0.2)
nRBC: 0 % (ref 0.0–0.2)

## 2020-10-11 LAB — TYPE AND SCREEN
ABO/RH(D): O POS
Antibody Screen: NEGATIVE

## 2020-10-11 LAB — PROTEIN / CREATININE RATIO, URINE
Creatinine, Urine: 350.48 mg/dL
Protein Creatinine Ratio: 0.06 mg/mg{Cre} (ref 0.00–0.15)
Total Protein, Urine: 22 mg/dL

## 2020-10-11 LAB — RPR: RPR Ser Ql: NONREACTIVE

## 2020-10-11 SURGERY — Surgical Case
Anesthesia: Epidural

## 2020-10-11 MED ORDER — NALBUPHINE HCL 10 MG/ML IJ SOLN: 5.0000 mg | Freq: Once | INTRAMUSCULAR | Status: DC | PRN

## 2020-10-11 MED ORDER — MEPERIDINE HCL 25 MG/ML IJ SOLN
INTRAMUSCULAR | Status: AC
  Filled 2020-10-11: qty 1

## 2020-10-11 MED ORDER — MORPHINE SULFATE (PF) 0.5 MG/ML IJ SOLN
INTRAMUSCULAR | Status: AC
  Filled 2020-10-11: qty 10

## 2020-10-11 MED ORDER — LACTATED RINGERS AMNIOINFUSION: INTRAVENOUS | Status: DC

## 2020-10-11 MED ORDER — ONDANSETRON HCL 4 MG/2ML IJ SOLN: 4.0000 mg | Freq: Four times a day (QID) | INTRAMUSCULAR | Status: DC | PRN

## 2020-10-11 MED ORDER — HYDROMORPHONE HCL 1 MG/ML IJ SOLN: 0.2500 mg | INTRAMUSCULAR | Status: DC | PRN

## 2020-10-11 MED ORDER — SODIUM CHLORIDE 0.9% FLUSH: 3.0000 mL | INTRAVENOUS | Status: DC | PRN

## 2020-10-11 MED ORDER — ONDANSETRON HCL 4 MG/2ML IJ SOLN: 4.0000 mg | Freq: Three times a day (TID) | INTRAMUSCULAR | Status: DC | PRN

## 2020-10-11 MED ORDER — LACTATED RINGERS IV SOLN: INTRAVENOUS | Status: DC

## 2020-10-11 MED ORDER — DEXAMETHASONE SODIUM PHOSPHATE 4 MG/ML IJ SOLN
INTRAMUSCULAR | Status: DC | PRN
  Administered 2020-10-11: 4 mg via INTRAVENOUS

## 2020-10-11 MED ORDER — DIPHENHYDRAMINE HCL 50 MG/ML IJ SOLN
12.5000 mg | INTRAMUSCULAR | Status: DC | PRN
Start: 2020-10-11 — End: 2020-10-12

## 2020-10-11 MED ORDER — NIFEDIPINE ER OSMOTIC RELEASE 30 MG PO TB24
30.0000 mg | ORAL_TABLET | Freq: Every day | ORAL | Status: DC
  Administered 2020-10-11: 30 mg via ORAL
  Filled 2020-10-11 (×2): qty 1

## 2020-10-11 MED ORDER — EPHEDRINE 5 MG/ML INJ: 10.0000 mg | INTRAVENOUS | Status: DC | PRN

## 2020-10-11 MED ORDER — KETOROLAC TROMETHAMINE 30 MG/ML IJ SOLN: 30.0000 mg | Freq: Four times a day (QID) | INTRAMUSCULAR | Status: AC | PRN

## 2020-10-11 MED ORDER — SOD CITRATE-CITRIC ACID 500-334 MG/5ML PO SOLN
30.0000 mL | ORAL | Status: AC
  Administered 2020-10-11: 30 mL via ORAL

## 2020-10-11 MED ORDER — DIPHENHYDRAMINE HCL 25 MG PO CAPS: 25.0000 mg | ORAL_CAPSULE | ORAL | Status: DC | PRN

## 2020-10-11 MED ORDER — ACETAMINOPHEN 325 MG PO TABS: 650.0000 mg | ORAL_TABLET | ORAL | Status: DC | PRN

## 2020-10-11 MED ORDER — CEFAZOLIN IN SODIUM CHLORIDE 3-0.9 GM/100ML-% IV SOLN: 3.0000 g | INTRAVENOUS | Status: DC

## 2020-10-11 MED ORDER — MEPERIDINE HCL 25 MG/ML IJ SOLN: 6.2500 mg | INTRAMUSCULAR | Status: DC | PRN

## 2020-10-11 MED ORDER — LIDOCAINE-EPINEPHRINE (PF) 2 %-1:200000 IJ SOLN
INTRAMUSCULAR | Status: DC | PRN
  Administered 2020-10-11: 3 mL via EPIDURAL
  Administered 2020-10-11: 10 mL via EPIDURAL

## 2020-10-11 MED ORDER — NALBUPHINE HCL 10 MG/ML IJ SOLN: 5.0000 mg | INTRAMUSCULAR | Status: DC | PRN

## 2020-10-11 MED ORDER — SODIUM CHLORIDE 0.9 % IR SOLN
Status: DC | PRN
  Administered 2020-10-11 (×2): 1

## 2020-10-11 MED ORDER — PROMETHAZINE HCL 25 MG/ML IJ SOLN: 6.2500 mg | INTRAMUSCULAR | Status: DC | PRN

## 2020-10-11 MED ORDER — DEXAMETHASONE SODIUM PHOSPHATE 4 MG/ML IJ SOLN
INTRAMUSCULAR | Status: AC
  Filled 2020-10-11: qty 1

## 2020-10-11 MED ORDER — PHENYLEPHRINE HCL-NACL 20-0.9 MG/250ML-% IV SOLN: INTRAVENOUS | Status: DC | PRN

## 2020-10-11 MED ORDER — KETOROLAC TROMETHAMINE 30 MG/ML IJ SOLN: 30.0000 mg | Freq: Once | INTRAMUSCULAR | Status: AC | PRN

## 2020-10-11 MED ORDER — PHENYLEPHRINE 40 MCG/ML (10ML) SYRINGE FOR IV PUSH (FOR BLOOD PRESSURE SUPPORT)
80.0000 ug | PREFILLED_SYRINGE | INTRAVENOUS | Status: DC | PRN
  Filled 2020-10-11 (×2): qty 10

## 2020-10-11 MED ORDER — OXYCODONE-ACETAMINOPHEN 5-325 MG PO TABS: 1.0000 | ORAL_TABLET | ORAL | Status: DC | PRN

## 2020-10-11 MED ORDER — FENTANYL-BUPIVACAINE-NACL 0.5-0.125-0.9 MG/250ML-% EP SOLN
12.0000 mL/h | EPIDURAL | Status: DC | PRN
  Filled 2020-10-11: qty 250

## 2020-10-11 MED ORDER — MORPHINE SULFATE (PF) 0.5 MG/ML IJ SOLN
INTRAMUSCULAR | Status: DC | PRN
  Administered 2020-10-11: 3 mg via EPIDURAL

## 2020-10-11 MED ORDER — TERBUTALINE SULFATE 1 MG/ML IJ SOLN
0.2500 mg | Freq: Once | INTRAMUSCULAR | Status: AC | PRN
  Administered 2020-10-11: 0.25 mg via SUBCUTANEOUS
  Filled 2020-10-11: qty 1

## 2020-10-11 MED ORDER — ACETAMINOPHEN 500 MG PO TABS: 1000.0000 mg | ORAL_TABLET | Freq: Four times a day (QID) | ORAL | Status: DC

## 2020-10-11 MED ORDER — SODIUM CHLORIDE 0.9 % IV SOLN
5.0000 10*6.[IU] | Freq: Once | INTRAVENOUS | Status: AC
  Administered 2020-10-11: 5 10*6.[IU] via INTRAVENOUS
  Filled 2020-10-11: qty 5

## 2020-10-11 MED ORDER — DIPHENHYDRAMINE HCL 50 MG/ML IJ SOLN: 12.5000 mg | INTRAMUSCULAR | Status: DC | PRN

## 2020-10-11 MED ORDER — OXYCODONE-ACETAMINOPHEN 5-325 MG PO TABS
2.0000 | ORAL_TABLET | ORAL | Status: DC | PRN
Start: 2020-10-11 — End: 2020-10-12

## 2020-10-11 MED ORDER — LACTATED RINGERS IV SOLN
500.0000 mL | INTRAVENOUS | Status: DC | PRN
  Administered 2020-10-11: 500 mL via INTRAVENOUS

## 2020-10-11 MED ORDER — PENICILLIN G POT IN DEXTROSE 60000 UNIT/ML IV SOLN
3.0000 10*6.[IU] | INTRAVENOUS | Status: DC
  Administered 2020-10-11 (×4): 3 10*6.[IU] via INTRAVENOUS
  Filled 2020-10-11 (×4): qty 50

## 2020-10-11 MED ORDER — FENTANYL CITRATE (PF) 100 MCG/2ML IJ SOLN
50.0000 ug | INTRAMUSCULAR | Status: DC | PRN
  Administered 2020-10-11 (×3): 100 ug via INTRAVENOUS
  Filled 2020-10-11 (×3): qty 2

## 2020-10-11 MED ORDER — OXYTOCIN BOLUS FROM INFUSION: 333.0000 mL | Freq: Once | INTRAVENOUS | Status: DC

## 2020-10-11 MED ORDER — LACTATED RINGERS IV SOLN: 500.0000 mL | Freq: Once | INTRAVENOUS | Status: DC

## 2020-10-11 MED ORDER — KETOROLAC TROMETHAMINE 30 MG/ML IJ SOLN
30.0000 mg | Freq: Four times a day (QID) | INTRAMUSCULAR | Status: AC | PRN
  Administered 2020-10-12 (×4): 30 mg via INTRAVENOUS
  Filled 2020-10-11 (×2): qty 1

## 2020-10-11 MED ORDER — ONDANSETRON HCL 4 MG/2ML IJ SOLN
INTRAMUSCULAR | Status: DC | PRN
  Administered 2020-10-11: 4 mg via INTRAVENOUS

## 2020-10-11 MED ORDER — OXYTOCIN-SODIUM CHLORIDE 30-0.9 UT/500ML-% IV SOLN
INTRAVENOUS | Status: AC
  Filled 2020-10-11: qty 500

## 2020-10-11 MED ORDER — FENTANYL-BUPIVACAINE-NACL 0.5-0.125-0.9 MG/250ML-% EP SOLN
EPIDURAL | Status: DC | PRN
  Administered 2020-10-11: 12 mL/h via EPIDURAL

## 2020-10-11 MED ORDER — STERILE WATER FOR IRRIGATION IR SOLN
Status: DC | PRN
  Administered 2020-10-11: 1

## 2020-10-11 MED ORDER — LIDOCAINE HCL (PF) 1 % IJ SOLN
INTRAMUSCULAR | Status: DC | PRN
  Administered 2020-10-11: 2 mL via EPIDURAL
  Administered 2020-10-11: 10 mL via EPIDURAL

## 2020-10-11 MED ORDER — ONDANSETRON HCL 4 MG/2ML IJ SOLN
INTRAMUSCULAR | Status: AC
  Filled 2020-10-11: qty 2

## 2020-10-11 MED ORDER — SCOPOLAMINE 1 MG/3DAYS TD PT72
1.0000 | MEDICATED_PATCH | Freq: Once | TRANSDERMAL | Status: DC
  Administered 2020-10-12: 1.5 mg via TRANSDERMAL

## 2020-10-11 MED ORDER — OXYCODONE HCL 5 MG/5ML PO SOLN
5.0000 mg | Freq: Once | ORAL | Status: DC | PRN
Start: 2020-10-11 — End: 2020-10-12

## 2020-10-11 MED ORDER — OXYTOCIN-SODIUM CHLORIDE 30-0.9 UT/500ML-% IV SOLN
1.0000 m[IU]/min | INTRAVENOUS | Status: DC
  Administered 2020-10-11: 2 m[IU]/min via INTRAVENOUS
  Filled 2020-10-11: qty 500

## 2020-10-11 MED ORDER — PHENYLEPHRINE 40 MCG/ML (10ML) SYRINGE FOR IV PUSH (FOR BLOOD PRESSURE SUPPORT)
80.0000 ug | PREFILLED_SYRINGE | INTRAVENOUS | Status: DC | PRN
  Administered 2020-10-11: 80 ug via INTRAVENOUS
  Administered 2020-10-11: 200 ug via INTRAVENOUS

## 2020-10-11 MED ORDER — ZOLPIDEM TARTRATE 5 MG PO TABS
5.0000 mg | ORAL_TABLET | Freq: Every evening | ORAL | Status: DC | PRN
  Administered 2020-10-11: 5 mg via ORAL
  Filled 2020-10-11: qty 1

## 2020-10-11 MED ORDER — CEFAZOLIN IN SODIUM CHLORIDE 3-0.9 GM/100ML-% IV SOLN
INTRAVENOUS | Status: AC
  Filled 2020-10-11: qty 100

## 2020-10-11 MED ORDER — SOD CITRATE-CITRIC ACID 500-334 MG/5ML PO SOLN
30.0000 mL | ORAL | Status: DC | PRN
  Filled 2020-10-11: qty 30

## 2020-10-11 MED ORDER — NALOXONE HCL 0.4 MG/ML IJ SOLN: 0.4000 mg | INTRAMUSCULAR | Status: DC | PRN

## 2020-10-11 MED ORDER — LACTATED RINGERS IV SOLN: INTRAVENOUS | Status: DC | PRN

## 2020-10-11 MED ORDER — NALOXONE HCL 4 MG/10ML IJ SOLN
1.0000 ug/kg/h | INTRAVENOUS | Status: DC | PRN
  Filled 2020-10-11: qty 5

## 2020-10-11 MED ORDER — OXYCODONE HCL 5 MG PO TABS: 5.0000 mg | ORAL_TABLET | Freq: Once | ORAL | Status: DC | PRN

## 2020-10-11 MED ORDER — DEXTROSE 5 % IV SOLN
INTRAVENOUS | Status: DC | PRN
  Administered 2020-10-11: 3 g via INTRAVENOUS

## 2020-10-11 MED ORDER — OXYTOCIN-SODIUM CHLORIDE 30-0.9 UT/500ML-% IV SOLN
INTRAVENOUS | Status: DC | PRN
  Administered 2020-10-11: 450 mL via INTRAVENOUS
  Administered 2020-10-11: 100 mL via INTRAVENOUS

## 2020-10-11 MED ORDER — LIDOCAINE HCL (PF) 1 % IJ SOLN: 30.0000 mL | INTRAMUSCULAR | Status: DC | PRN

## 2020-10-11 MED ORDER — OXYTOCIN-SODIUM CHLORIDE 30-0.9 UT/500ML-% IV SOLN: 2.5000 [IU]/h | INTRAVENOUS | Status: DC

## 2020-10-11 MED ORDER — MEPERIDINE HCL 25 MG/ML IJ SOLN
INTRAMUSCULAR | Status: DC | PRN
  Administered 2020-10-11 (×2): 12.5 mg via INTRAVENOUS

## 2020-10-11 MED ORDER — HYDROXYZINE HCL 50 MG PO TABS: 50.0000 mg | ORAL_TABLET | Freq: Four times a day (QID) | ORAL | Status: DC | PRN

## 2020-10-11 MED ORDER — MISOPROSTOL 25 MCG QUARTER TABLET
25.0000 ug | ORAL_TABLET | ORAL | Status: DC | PRN
  Administered 2020-10-11 (×2): 25 ug via VAGINAL
  Filled 2020-10-11 (×2): qty 1

## 2020-10-11 SURGICAL SUPPLY — 38 items
BARRIER ADHS 3X4 INTERCEED (GAUZE/BANDAGES/DRESSINGS) ×2 IMPLANT
BENZOIN TINCTURE PRP APPL 2/3 (GAUZE/BANDAGES/DRESSINGS) ×2 IMPLANT
CHLORAPREP W/TINT 26ML (MISCELLANEOUS) ×2 IMPLANT
CLAMP CORD UMBIL (MISCELLANEOUS) IMPLANT
CLOTH BEACON ORANGE TIMEOUT ST (SAFETY) ×2 IMPLANT
DRSG OPSITE POSTOP 4X10 (GAUZE/BANDAGES/DRESSINGS) ×2 IMPLANT
ELECT REM PT RETURN 9FT ADLT (ELECTROSURGICAL) ×2
ELECTRODE REM PT RTRN 9FT ADLT (ELECTROSURGICAL) ×1 IMPLANT
EXTRACTOR VACUUM KIWI (MISCELLANEOUS) IMPLANT
GAUZE SPONGE 4X4 12PLY STRL LF (GAUZE/BANDAGES/DRESSINGS) ×4 IMPLANT
GLOVE BIOGEL M 7.0 STRL (GLOVE) ×4 IMPLANT
GLOVE BIOGEL PI IND STRL 7.0 (GLOVE) ×3 IMPLANT
GLOVE BIOGEL PI INDICATOR 7.0 (GLOVE) ×3
GOWN STRL REUS W/TWL LRG LVL3 (GOWN DISPOSABLE) ×6 IMPLANT
HOVERMATT SINGLE USE (MISCELLANEOUS) ×2 IMPLANT
KIT ABG SYR 3ML LUER SLIP (SYRINGE) IMPLANT
NEEDLE HYPO 25X5/8 SAFETYGLIDE (NEEDLE) IMPLANT
NS IRRIG 1000ML POUR BTL (IV SOLUTION) ×2 IMPLANT
PACK C SECTION WH (CUSTOM PROCEDURE TRAY) ×2 IMPLANT
PAD ABD 7.5X8 STRL (GAUZE/BANDAGES/DRESSINGS) ×2 IMPLANT
PAD OB MATERNITY 4.3X12.25 (PERSONAL CARE ITEMS) ×2 IMPLANT
PENCIL SMOKE EVAC W/HOLSTER (ELECTROSURGICAL) ×2 IMPLANT
RETRACTOR TRAXI PANNICULUS (MISCELLANEOUS) ×1 IMPLANT
RTRCTR C-SECT PINK 25CM LRG (MISCELLANEOUS) IMPLANT
STRIP CLOSURE SKIN 1/2X4 (GAUZE/BANDAGES/DRESSINGS) ×2 IMPLANT
SUT MNCRL 0 VIOLET CTX 36 (SUTURE) ×2 IMPLANT
SUT MONOCRYL 0 CTX 36 (SUTURE) ×4
SUT PDS AB 0 CT1 27 (SUTURE) ×4 IMPLANT
SUT PLAIN 0 NONE (SUTURE) IMPLANT
SUT VIC AB 2-0 CT1 27 (SUTURE) ×4
SUT VIC AB 2-0 CT1 TAPERPNT 27 (SUTURE) ×2 IMPLANT
SUT VIC AB 3-0 SH 27 (SUTURE)
SUT VIC AB 3-0 SH 27X BRD (SUTURE) IMPLANT
SUT VIC AB 4-0 KS 27 (SUTURE) ×2 IMPLANT
TOWEL OR 17X24 6PK STRL BLUE (TOWEL DISPOSABLE) ×2 IMPLANT
TRAXI PANNICULUS RETRACTOR (MISCELLANEOUS) ×1
TRAY FOLEY W/BAG SLVR 14FR LF (SET/KITS/TRAYS/PACK) ×2 IMPLANT
WATER STERILE IRR 1000ML POUR (IV SOLUTION) ×2 IMPLANT

## 2020-10-11 NOTE — Progress Notes (Signed)
Ashley Pham is a 40 y.o. G2P1000 at [redacted]w[redacted]d admitted for induction of labor due to Chronic Hypertension.  Subjective:  Call from RN to come and verify presentation. BS U/S done. Vertex.   Objective: BP 132/73   Pulse 86   Temp 98.3 F (36.8 C) (Oral)   Resp 18   Ht 5\' 3"  (1.6 m)   Wt 126 kg   SpO2 98%   BMI 49.21 kg/m  No intake/output data recorded. No intake/output data recorded.  FHT:  FHR: 115 bpm, variability: moderate,  accelerations:  Present,  decelerations:  Absent UC:   occasional SVE:   Dilation: 1 Effacement (%): 30 Station: Ballotable Exam by:: [email or web address], RN  Labs: Lab Results  Component Value Date   WBC 13.4 (H) 10/11/2020   HGB 11.0 (L) 10/11/2020   HCT 33.8 (L) 10/11/2020   MCV 79.2 (L) 10/11/2020   PLT 234 10/11/2020    Assessment / Plan: Admitted for IOL for CHTN. Bedside ultrasound done to confirm presentation. Vertex.   Labor:  Cytotec 10/13/2020 per vagina  Fetal Wellbeing:  Category I Pain Control:  Epidural prn I/D:   GBS positive Anticipated MOD:  NSVD  Jones Viviani MSN, CNM 10/11/2020, 1:49 AM

## 2020-10-11 NOTE — Anesthesia Procedure Notes (Signed)
Epidural Patient location during procedure: OB Start time: 10/11/2020 6:43 PM End time: 10/11/2020 6:53 PM  Staffing Anesthesiologist: Lannie Fields, DO Performed: anesthesiologist   Preanesthetic Checklist Completed: patient identified, IV checked, risks and benefits discussed, monitors and equipment checked, pre-op evaluation and timeout performed  Epidural Patient position: sitting Prep: DuraPrep and site prepped and draped Patient monitoring: continuous pulse ox, blood pressure, heart rate and cardiac monitor Approach: midline Location: L3-L4 Injection technique: LOR air  Needle:  Needle type: Tuohy  Needle gauge: 17 G Needle length: 9 cm Needle insertion depth: 9 cm Catheter type: closed end flexible Catheter size: 19 Gauge Catheter at skin depth: 15 cm Test dose: negative  Assessment Sensory level: T8 Events: blood not aspirated, injection not painful, no injection resistance, no paresthesia and negative IV test  Additional Notes Patient identified. Risks/Benefits/Options discussed with patient including but not limited to bleeding, infection, nerve damage, paralysis, failed block, incomplete pain control, headache, blood pressure changes, nausea, vomiting, reactions to medication both or allergic, itching and postpartum back pain. Confirmed with bedside nurse the patient's most recent platelet count. Confirmed with patient that they are not currently taking any anticoagulation, have any bleeding history or any family history of bleeding disorders. Patient expressed understanding and wished to proceed. All questions were answered. Sterile technique was used throughout the entire procedure. Please see nursing notes for vital signs. Test dose was given through epidural catheter and negative prior to continuing to dose epidural or start infusion. Warning signs of high block given to the patient including shortness of breath, tingling/numbness in hands, complete motor  block, or any concerning symptoms with instructions to call for help. Patient was given instructions on fall risk and not to get out of bed. All questions and concerns addressed with instructions to call with any issues or inadequate analgesia.  Reason for block:procedure for pain

## 2020-10-11 NOTE — Progress Notes (Signed)
   Subjective: Patient reports contractions are more painful she is interested in an epidural . +FM no lof no vaginal bleeding.   Objective: BP 119/60   Pulse 76   Temp 98.1 F (36.7 C) (Oral)   Resp 16   Ht 5\' 3"  (1.6 m)   Wt 126 kg   SpO2 98%   BMI 49.21 kg/m  I/O last 3 completed shifts: In: -  Out: 300 [Urine:300] No intake/output data recorded.  FHT:  FHR: 120 bpm, variability: moderate,  accelerations:  Present,  decelerations:  Absent UC:   not graphing but per nurse and pt every 2-3 minutes  SVE:   Dilation: 3.5 Effacement (%): 70 Station: -3 Exam by:: [email or web address], RN  Labs: Lab Results  Component Value Date   WBC 11.6 (H) 10/11/2020   HGB 10.8 (L) 10/11/2020   HCT 32.7 (L) 10/11/2020   MCV 78.0 (L) 10/11/2020   PLT 203 10/11/2020    Assessment / Plan: Induction of labor due to chronic hypertension ,  progressing well on pitocin  Labor: Progressing on Pitocin, will continue to increase then AROM Preeclampsia:  labs stable Fetal Wellbeing:  Category I Pain Control:   planning epidural  I/D:   Penicillin  Anticipated MOD:  NSVD  10/13/2020 10/11/2020, 6:24 PM

## 2020-10-11 NOTE — Anesthesia Preprocedure Evaluation (Signed)
Anesthesia Evaluation  Patient identified by MRN, date of birth, ID band Patient awake    Reviewed: Allergy & Precautions, Patient's Chart, lab work & pertinent test results  Airway Mallampati: II  TM Distance: >3 FB Neck ROM: Full    Dental no notable dental hx.    Pulmonary neg pulmonary ROS, former smoker,    Pulmonary exam normal breath sounds clear to auscultation       Cardiovascular hypertension, Pt. on medications Normal cardiovascular exam Rhythm:Regular Rate:Normal     Neuro/Psych  Headaches, PSYCHIATRIC DISORDERS Anxiety Depression    GI/Hepatic negative GI ROS, Neg liver ROS,   Endo/Other  Morbid obesityBMI 49  Renal/GU negative Renal ROS  negative genitourinary   Musculoskeletal negative musculoskeletal ROS (+)   Abdominal (+) + obese,   Peds negative pediatric ROS (+)  Hematology  (+) Blood dyscrasia, anemia , hct 32.7, plt 203   Anesthesia Other Findings   Reproductive/Obstetrics (+) Pregnancy                             Anesthesia Physical Anesthesia Plan  ASA: 3  Anesthesia Plan: Epidural   Post-op Pain Management:    Induction:   PONV Risk Score and Plan: 2  Airway Management Planned: Natural Airway  Additional Equipment: None  Intra-op Plan:   Post-operative Plan:   Informed Consent: I have reviewed the patients History and Physical, chart, labs and discussed the procedure including the risks, benefits and alternatives for the proposed anesthesia with the patient or authorized representative who has indicated his/her understanding and acceptance.       Plan Discussed with:   Anesthesia Plan Comments:         Anesthesia Quick Evaluation

## 2020-10-11 NOTE — Progress Notes (Signed)
  Subjective: Pateint was tired of hand an knee positioning. She was placed in the left lateral decub and fetus had recurrent deep variable decelerations   Objective: BP (!) 112/57   Pulse 90   Temp 98 F (36.7 C) (Oral)   Resp 16   Ht 5\' 3"  (1.6 m)   Wt 126 kg   SpO2 100%   BMI 49.21 kg/m  I/O last 3 completed shifts: In: -  Out: 300 [Urine:300] No intake/output data recorded.  FHT:  FHR: 130 bpm, variability: moderate,  accelerations:  Present,  decelerations:  Present variable and late  UC:   regular, every 4 minutes SVE:   Dilation: 8 Effacement (%): 100 Station: 0 Exam by:: Dr. [email or web address]  Labs: Lab Results  Component Value Date   WBC 11.6 (H) 10/11/2020   HGB 10.8 (L) 10/11/2020   HCT 32.7 (L) 10/11/2020   MCV 78.0 (L) 10/11/2020   PLT 203 10/11/2020    Assessment / Plan:  Pt with Category 3 tracing recommend cesarean section.  Recommend cesarean section. R/B/A of cesarean section discussed with the patient including but not limited to infection, bleeding damage to bowel bladder and baby with the need for further surgery. R/O transfusion HIV/ Hep B&C discussed. Pt voiced understanding and desires to proceed with cesarean section.   10/13/2020 10/11/2020, 10:37 PM

## 2020-10-11 NOTE — Progress Notes (Signed)
   Subjective:  Patient reports increased pressure with contractions.   Objective: BP (!) 112/57   Pulse 90   Temp 98 F (36.7 C) (Oral)   Resp 16   Ht 5\' 3"  (1.6 m)   Wt 126 kg   SpO2 100%   BMI 49.21 kg/m  I/O last 3 completed shifts: In: -  Out: 300 [Urine:300] No intake/output data recorded.  FHT:  FHR: 130 bpm, variability: moderate,  accelerations:  Present,  decelerations:  Present variable / late  IUPC and FSE placed with very deep variable noted to the 80's with slow return to baseline. Amnioinfusion started and pitocin cut to half. Deep variables continued pitocin was discontinued and terbutaline was given. Pt placed in hand /knee posistion with resolution of deep u shaped variable.   UC:   regular, every 2 minutes SVE:   Dilation: 8 Effacement (%): 100 Station: 0 Exam by:: Dr. [email or web address]  Labs: Lab Results  Component Value Date   WBC 11.6 (H) 10/11/2020   HGB 10.8 (L) 10/11/2020   HCT 32.7 (L) 10/11/2020   MCV 78.0 (L) 10/11/2020   PLT 203 10/11/2020    Assessment / Plan: Induction of labor due to chronic hypertension,  progressing well on pitocin  Labor:  progressing  Preeclampsia:  labs stable Fetal Wellbeing:  Category II and Category III.10/13/2020 was category 3 but resolved with cessation of pitocin/ amnioinfusion / hand knee positioning and terbutaline Pain Control:  Epidural I/D:   Penicillin Anticipated MOD:   questionable I discussed with Ms. Spruil cesarean section given category 3 tracing however it resolved and is now more reassuring will monitor closely if becomes category 3 or remains category 2 for over 30 minutes recommend cesarean section.   Marland Kitchen 10/11/2020, 9:52 PM

## 2020-10-12 ENCOUNTER — Encounter (HOSPITAL_COMMUNITY): Payer: Self-pay | Admitting: Obstetrics and Gynecology

## 2020-10-12 LAB — CBC
HCT: 30.6 % — ABNORMAL LOW (ref 36.0–46.0)
HCT: 29.5 % — ABNORMAL LOW (ref 36.0–46.0)
Hemoglobin: 10.2 g/dL — ABNORMAL LOW (ref 12.0–15.0)
Hemoglobin: 9.6 g/dL — ABNORMAL LOW (ref 12.0–15.0)
MCH: 26 pg (ref 26.0–34.0)
MCH: 25.5 pg — ABNORMAL LOW (ref 26.0–34.0)
MCHC: 33.3 g/dL (ref 30.0–36.0)
MCHC: 32.5 g/dL (ref 30.0–36.0)
MCV: 78.1 fL — ABNORMAL LOW (ref 80.0–100.0)
MCV: 78.2 fL — ABNORMAL LOW (ref 80.0–100.0)
Platelets: 186 10*3/uL (ref 150–400)
Platelets: 170 10*3/uL (ref 150–400)
RBC: 3.92 MIL/uL (ref 3.87–5.11)
RBC: 3.77 MIL/uL — ABNORMAL LOW (ref 3.87–5.11)
RDW: 14.6 % (ref 11.5–15.5)
RDW: 14.5 % (ref 11.5–15.5)
WBC: 20.7 10*3/uL — ABNORMAL HIGH (ref 4.0–10.5)
WBC: 18.2 10*3/uL — ABNORMAL HIGH (ref 4.0–10.5)
nRBC: 0 % (ref 0.0–0.2)
nRBC: 0 % (ref 0.0–0.2)

## 2020-10-12 MED ORDER — ACETAMINOPHEN 500 MG PO TABS
1000.0000 mg | ORAL_TABLET | Freq: Four times a day (QID) | ORAL | Status: DC
  Administered 2020-10-12 – 2020-10-14 (×9): 1000 mg via ORAL
  Filled 2020-10-12 (×9): qty 2

## 2020-10-12 MED ORDER — ENOXAPARIN SODIUM 60 MG/0.6ML IJ SOSY
60.0000 mg | PREFILLED_SYRINGE | INTRAMUSCULAR | Status: DC
  Administered 2020-10-12 – 2020-10-13 (×2): 60 mg via SUBCUTANEOUS
  Filled 2020-10-12 (×2): qty 0.6

## 2020-10-12 MED ORDER — ZOLPIDEM TARTRATE 5 MG PO TABS: 5.0000 mg | ORAL_TABLET | Freq: Every evening | ORAL | Status: DC | PRN

## 2020-10-12 MED ORDER — PANTOPRAZOLE SODIUM 40 MG PO TBEC
40.0000 mg | DELAYED_RELEASE_TABLET | Freq: Every day | ORAL | Status: DC
  Administered 2020-10-12 – 2020-10-14 (×3): 40 mg via ORAL
  Filled 2020-10-12 (×3): qty 1

## 2020-10-12 MED ORDER — COCONUT OIL OIL
1.0000 "application " | TOPICAL_OIL | Status: DC | PRN
  Administered 2020-10-14: 1 via TOPICAL

## 2020-10-12 MED ORDER — INFLUENZA VAC SPLIT QUAD 0.5 ML IM SUSY
0.5000 mL | PREFILLED_SYRINGE | INTRAMUSCULAR | Status: AC
  Administered 2020-10-14: 0.5 mL via INTRAMUSCULAR
  Filled 2020-10-12: qty 0.5

## 2020-10-12 MED ORDER — SIMETHICONE 80 MG PO CHEW: 80.0000 mg | CHEWABLE_TABLET | ORAL | Status: DC | PRN

## 2020-10-12 MED ORDER — SENNOSIDES-DOCUSATE SODIUM 8.6-50 MG PO TABS
2.0000 | ORAL_TABLET | Freq: Every day | ORAL | Status: DC
  Administered 2020-10-13: 2 via ORAL
  Filled 2020-10-12 (×2): qty 2

## 2020-10-12 MED ORDER — PHENYLEPHRINE HCL (PRESSORS) 10 MG/ML IV SOLN
INTRAVENOUS | Status: DC | PRN
  Administered 2020-10-11 – 2020-10-12 (×3): 40 ug via INTRAVENOUS

## 2020-10-12 MED ORDER — OXYCODONE HCL 5 MG PO TABS
5.0000 mg | ORAL_TABLET | ORAL | Status: DC | PRN
  Administered 2020-10-13: 5 mg via ORAL
  Filled 2020-10-12: qty 2

## 2020-10-12 MED ORDER — SIMETHICONE 80 MG PO CHEW
80.0000 mg | CHEWABLE_TABLET | Freq: Three times a day (TID) | ORAL | Status: DC
  Administered 2020-10-12 – 2020-10-14 (×6): 80 mg via ORAL
  Filled 2020-10-12 (×6): qty 1

## 2020-10-12 MED ORDER — ENOXAPARIN SODIUM 40 MG/0.4ML IJ SOSY: 40.0000 mg | PREFILLED_SYRINGE | INTRAMUSCULAR | Status: DC

## 2020-10-12 MED ORDER — OXYTOCIN-SODIUM CHLORIDE 30-0.9 UT/500ML-% IV SOLN: 2.5000 [IU]/h | INTRAVENOUS | Status: AC

## 2020-10-12 MED ORDER — SCOPOLAMINE 1 MG/3DAYS TD PT72
MEDICATED_PATCH | TRANSDERMAL | Status: AC
  Filled 2020-10-12: qty 1

## 2020-10-12 MED ORDER — DIPHENHYDRAMINE HCL 25 MG PO CAPS: 25.0000 mg | ORAL_CAPSULE | Freq: Four times a day (QID) | ORAL | Status: DC | PRN

## 2020-10-12 MED ORDER — DIBUCAINE (PERIANAL) 1 % EX OINT: 1.0000 "application " | TOPICAL_OINTMENT | CUTANEOUS | Status: DC | PRN

## 2020-10-12 MED ORDER — PRENATAL MULTIVITAMIN CH
1.0000 | ORAL_TABLET | Freq: Every day | ORAL | Status: DC
  Administered 2020-10-12 – 2020-10-14 (×3): 1 via ORAL
  Filled 2020-10-12 (×3): qty 1

## 2020-10-12 MED ORDER — IBUPROFEN 600 MG PO TABS
600.0000 mg | ORAL_TABLET | Freq: Four times a day (QID) | ORAL | Status: DC
  Administered 2020-10-13 – 2020-10-14 (×6): 600 mg via ORAL
  Filled 2020-10-12 (×8): qty 1

## 2020-10-12 MED ORDER — LACTATED RINGERS IV SOLN: INTRAVENOUS | Status: DC

## 2020-10-12 MED ORDER — NIFEDIPINE ER OSMOTIC RELEASE 30 MG PO TB24
30.0000 mg | ORAL_TABLET | Freq: Every day | ORAL | Status: DC
  Administered 2020-10-12 – 2020-10-14 (×3): 30 mg via ORAL
  Filled 2020-10-12 (×3): qty 1

## 2020-10-12 MED ORDER — HYDROMORPHONE HCL 1 MG/ML IJ SOLN: 0.2000 mg | INTRAMUSCULAR | Status: DC | PRN

## 2020-10-12 MED ORDER — SERTRALINE HCL 50 MG PO TABS: 150.0000 mg | ORAL_TABLET | Freq: Every day | ORAL | Status: DC

## 2020-10-12 MED ORDER — WITCH HAZEL-GLYCERIN EX PADS: 1.0000 "application " | MEDICATED_PAD | CUTANEOUS | Status: DC | PRN

## 2020-10-12 MED ORDER — KETOROLAC TROMETHAMINE 30 MG/ML IJ SOLN
INTRAMUSCULAR | Status: AC
  Filled 2020-10-12: qty 1

## 2020-10-12 MED ORDER — MENTHOL 3 MG MT LOZG: 1.0000 | LOZENGE | OROMUCOSAL | Status: DC | PRN

## 2020-10-12 MED ORDER — FERROUS SULFATE 325 (65 FE) MG PO TABS
325.0000 mg | ORAL_TABLET | Freq: Two times a day (BID) | ORAL | Status: DC
  Administered 2020-10-12 – 2020-10-13 (×4): 325 mg via ORAL
  Filled 2020-10-12 (×5): qty 1

## 2020-10-12 MED ORDER — SERTRALINE HCL 50 MG PO TABS
50.0000 mg | ORAL_TABLET | Freq: Every day | ORAL | Status: DC
  Administered 2020-10-12 – 2020-10-14 (×3): 50 mg via ORAL
  Filled 2020-10-12 (×3): qty 1

## 2020-10-12 NOTE — Transfer of Care (Signed)
Immediate Anesthesia Transfer of Care Note  Patient: Ashley Pham  Procedure(s) Performed: CESAREAN SECTION  Patient Location: PACU  Anesthesia Type:Epidural  Level of Consciousness: awake, alert  and patient cooperative  Airway & Oxygen Therapy: Patient Spontanous Breathing  Post-op Assessment: Report given to RN and Post -op Vital signs reviewed and stable  Post vital signs: Reviewed and stable  Last Vitals:  Vitals Value Taken Time  BP 135/80 10/12/20 0041  Temp    Pulse 99 10/12/20 0045  Resp 24 10/12/20 0045  SpO2 100 % 10/12/20 0045  Vitals shown include unvalidated device data.  Last Pain:  Vitals:   10/11/20 2012  TempSrc: Oral  PainSc:          Complications: No notable events documented.

## 2020-10-12 NOTE — Plan of Care (Signed)

## 2020-10-12 NOTE — Progress Notes (Signed)
Postpartum Note Day #1  S:  Patient doing well.  Pain controlled.  Tolerating clears diet.   Has not yet ambulated. Foley still in place. Denies fevers, chills, chest pain, SOB, N/V, or worsening bilateral LE edema.  Lochia: Minimal Circumcision:  Desires prior to discharge   O: Temp:  [97.7 F (36.5 C)-99.8 F (37.7 C)] 97.7 F (36.5 C) (09/29 0300) Pulse Rate:  [59-104] 80 (09/29 0300) Resp:  [16-23] 16 (09/29 0600) BP: (78-142)/(36-112) 116/59 (09/29 0300) SpO2:  [99 %-100 %] 100 % (09/29 0600) Gen: NAD, pleasant and cooperative CV: RRR Resp: CTAB, no wheezes/rales/rhonchi Abdomen: soft, non-distended, non-tender throughout Uterus: firm, non-tender, below umbilicus Incision: c/d/i, bandage in place  Ext: No bilateral LE edema, no bilateral calf tenderness, SCDs on and working  Foley: ~250cc concentrated urine in bag  Labs:  Recent Labs    10/12/20 0137 10/12/20 0533  HGB 10.2* 9.6*    A/P: Patient is a 40 y.o. G2P2001 POD#1 s/p LTCS.  - Pain well controlled  - GU: UOP is adequate - GI: Tolerating regular diet - Activity: encouraged sitting up to chair and ambulation as tolerated - DVT Prophylaxis: SCDs, Lovenox prophylaxis - Labs: as above  Disposition:  D/C home likely POD#2-3   Steva Ready, DO [phone] (office)

## 2020-10-12 NOTE — Op Note (Signed)
Cesarean Section Procedure Note  Indications: non-reassuring fetal status  Pre-operative Diagnosis: 38 week 1 day pregnancy.  Post-operative Diagnosis: same  Surgeon: Gerald Leitz   Assistants: Rhea Pink CNM  Anesthesia: Epidural anesthesia  ASA Class: 2   Procedure Details   The patient was seen in the Holding Room. The risks, benefits, complications, treatment options, and expected outcomes were discussed with the patient.  The patient concurred with the proposed plan, giving informed consent.  The site of surgery properly noted/marked. The patient was taken to Operating Room # C, identified as Regene L Ravelo and the procedure verified as C-Section Delivery. A Time Out was held and the above information confirmed.  After induction of anesthesia, the patient was draped and prepped in the usual sterile manner. A Pfannenstiel incision was made and carried down through the subcutaneous tissue to the fascia. Fascial incision was made and extended transversely. The fascia was separated from the underlying rectus tissue superiorly and inferiorly. The peritoneum was identified and entered. Peritoneal incision was extended longitudinally. The utero-vesical peritoneal reflection was incised transversely and the bladder flap was bluntly freed from the lower uterine segment. A low transverse uterine incision was made. Delivered from cephalic presentation was a  Female with Apgar scores of 8 at one minute and 9 at five minutes. After the umbilical cord was clamped and cut cord blood was obtained for evaluation. The placenta was removed intact and appeared normal. The uterine outline, tubes and ovaries appeared normal. The uterine incision was closed with running locked sutures of  0 monocryl. A second layer of 0 monocryl was used to imbricate the incision.  . Hemostasis was observed. Lavage was carried out until clear. The fascia was then reapproximated with running sutures of  0 pds . The skin was  reapproximated with  4-0 vicryl  .  Instrument, sponge, and needle counts were correct prior the abdominal closure and at the conclusion of the case.   Findings: Female infant in cephalic presentation .Nuchal cord x 1 . Clear amniotic fluid   Estimated Blood Loss:   1358         Drains: None         Total IV Fluids:  2500 LR ml         Specimens: Placenta To labor and delivery          Implants: None         Complications:  None; patient tolerated the procedure well.         Disposition: PACU - hemodynamically stable.         Condition: stable  Attending Attestation: I performed the procedure.

## 2020-10-12 NOTE — Lactation Note (Signed)
This note was copied from a baby's chart. Lactation Consultation Note  Patient Name: Ashley Pham [id number] Date: 10/12/2020 Reason for consult: Initial assessment;Maternal endocrine disorder, less than 6 pounds , Early term,  P 2 , exp BF , baby is having temp instability, baby due to feed and showed mom how to PACE feed - 15 ml .  DEBP set up with instructions. For now mom will be pumping around feeding time to assist to enhance milk supply.  LC reviewed ET potential feeding  behavior and less than 6 pounds.  Age:38 hours  Maternal Data Has patient been taught Hand Expression?: Yes Does the patient have breastfeeding experience prior to this delivery?: Yes How long did the patient breastfeed?: per mom 3 months / ( 9 years ago ) milk came in well  Feeding Mother's Current Feeding Choice: Breast Milk and Formula (desires donor milk / unvailable) Nipple Type: Slow - flow  LATCH Score    Lactation Tools Discussed/Used Tools: Pump;Flanges Flange Size: 24;27 (#24 F fit the best) Breast pump type: Double-Electric Breast Pump Pump Education: Setup, frequency, and cleaning Reason for Pumping: ET / less than/ DL  Interventions Interventions: Breast feeding basics reviewed;Hand express;DEBP;Education;Pace feeding;LC Services brochure  Discharge    Consult Status Consult Status: Follow-up Date: 10/12/20 Follow-up type: In-patient    Ashley Pham 10/12/2020, 9:23 AM

## 2020-10-12 NOTE — Clinical Social Work Maternal (Signed)
CLINICAL SOCIAL WORK MATERNAL/CHILD NOTE  Patient Details  Name: Ashley Pham MRN: [id number] Date of Birth: 03/27/1980  Date:  10/12/2020  Clinical Social Worker Initiating Note:  Flem Enderle, LCSW Date/Time: Initiated:  10/12/20/1100     Child's Name:  Ashley Pham   Biological Parents:  Mother, Father (Ashley Pham 01-07-1981)   Need for Interpreter:  None   Reason for Referral:  Other (Comment), Behavioral Health Concerns (Marijuana use noted in H&P)   Address:  [address] [place] [address]    Phone number:  [phone] (home) [phone] (work)    Additional phone number:   Household Members/Support Persons (HM/SP):   Household Member/Support Person 1, Household Member/Support Person 2   HM/SP Name Relationship DOB or Age  HM/SP -1 Ashley Pham Significant Other 01-07-1981  HM/SP -2 Ashley Pham Daughter 9  HM/SP -3        HM/SP -4        HM/SP -5        HM/SP -6        HM/SP -7        HM/SP -8          Natural Supports (not living in the home):  Friends, Church  Professional Supports: None   Employment: Full-time   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[employer]   Education:  Graduate degree   Homebound arranged:    Financial Resources:  Private Insurance    Other Resources:      Cultural/Religious Considerations Which May Impact Care:    Strengths:  Ability to meet basic needs  , Home prepared for child  , Psychotropic Medications, Pediatrician chosen   Psychotropic Medications:  Zoloft      Pediatrician:    [place] area  Pediatrician List:   [facility]  [place]    [place]    [place]    [place]    [place]      Pediatrician Fax Number:    Risk Factors/Current Problems:      Cognitive State:  Able to Concentrate  , Alert  , Linear Thinking  , Insightful     Mood/Affect:  Calm  , Bright  , Comfortable  , Happy     CSW Assessment: CSW received consult for  hx of Anxiety, Depression [place] 12. CSW met with MOB to offer support and complete assessment.  CSW completed chart review and saw Marijuana use noted in H&P but nothing noted in the [facility] records about THC use. CSW discussed with the Pediatrician. CSW informed by Pediatrician the H&P may have been copied from previous visits and to follow up with MOB about use.  CSW met with MOB at bedside and introduced CSW role. CSW observed MOB holding and bonding with infant as evidenced by her talking and kissing the infant. MOB presented pleasant and welcomed CSW visit. MOB confirmed the demographic information on file is correct. CSW inquired how MOB has felt since giving birth. MOB expressed feeling "sweaty and hot." She reported feeling somewhat cooler with the small fan blowing. CSW shared she labored starting on the 9/27 and delivered the infant on 9/28, she expressed feeling very joyous about the labor ending. MOB shared she was grateful to have FOB at bedside because during the pregnancy it seemed FOB wasn't happy about the baby based on previous conversations regarding her pregnancy. MOB shared FOB has been attentive to her and the infant which have given her hope about their relationship. CSW discussed   [place] and acknowledged MOB feelings. MOB disclosed was diagnosed with depression and anxiety 2005-2006. She was prescribed Zoloft medication at the time but stopped taking it because of the side effects. She was then prescribed Cymbalta and Wellbutrin which she has taken for years and stopped after learning she was pregnant and again prescribed Zoloft.  MOB shared she plans to change back to Wellbutrin and Cymbalta post-partum because the medication is helpful. MOB reported previous therapy treatment in 2005. MOB shared she experienced postpartum depression after the birth her daughter as evidenced by tearfulness and feeling anxious. CSW provided education regarding the baby blues period vs. perinatal mood  disorders, discussed treatment and gave resources for mental health follow up if concerns arise.  CSW recommended MOB complete a self-evaluation during the postpartum time period using the New Mom Checklist from Postpartum Progress and encouraged MOB to contact a medical professional if symptoms are noted at any time. MOB reported she feels comfortable reaching out to the doctor if concerns arise. CSW assessed MOB for safety. MOB denied thoughts of harm to self and others. CSW inquired about MOB supports. MOB identified FOB and friends as supports.   CSW asked MOB if she used any substances during pregnancy. MOB denied using any substances during pregnancy. CSW inquired about marijuana use. MOB shared she used marijuana in the past but did not use during pregnancy.   CSW provided review of Sudden Infant Death Syndrome (SIDS) precautions. MOB reported she has all essential items for the infant including a bassinet where the infant will sleep. MOB has chosen [facility] for infant's follow up care. MOB confirmed she has transportation.    -CSW provided update to Pediatrician. CSW identifies no further need for intervention and no barriers to discharge at this time.  CSW Plan/Description:  Sudden Infant Death Syndrome (SIDS) Education, Perinatal Mood and Anxiety Disorder (PMADs) Education, No Further Intervention Required/No Barriers to Discharge    Rumor Sun A Naiah Donahoe, LCSW 10/12/2020, 2:30PM 

## 2020-10-12 NOTE — Plan of Care (Signed)
Care plan progressing  Alyana Kreiter T RN 

## 2020-10-12 NOTE — Anesthesia Postprocedure Evaluation (Signed)
Anesthesia Post Note  Patient: Ashley Pham  Procedure(s) Performed: CESAREAN SECTION     Patient location during evaluation: PACU Anesthesia Type: Epidural Level of consciousness: awake and alert and oriented Pain management: pain level controlled Vital Signs Assessment: post-procedure vital signs reviewed and stable Respiratory status: spontaneous breathing, nonlabored ventilation and respiratory function stable Cardiovascular status: blood pressure returned to baseline and stable Postop Assessment: no headache, no backache, patient able to bend at knees, epidural receding and no apparent nausea or vomiting Anesthetic complications: no   No notable events documented.  Last Vitals:  Vitals:   10/12/20 0145 10/12/20 0150  BP:  131/80  Pulse: 93 93  Resp: 20 20  Temp:    SpO2: 100% 100%    Last Pain:  Vitals:   10/12/20 0145  TempSrc:   PainSc: 0-No pain   Pain Goal:    LLE Motor Response: Non-purposeful movement (10/12/20 0130) LLE Sensation: Tingling (10/12/20 0130) RLE Motor Response: Non-purposeful movement (10/12/20 0130) RLE Sensation: Tingling (10/12/20 0130)     Epidural/Spinal Function Cutaneous sensation: Tingles (10/12/20 0130), Patient able to flex knees: Yes (10/12/20 0130), Patient able to lift hips off bed: Yes (10/12/20 0130), Back pain beyond tenderness at insertion site: No (10/12/20 0130), Progressively worsening motor and/or sensory loss: No (10/12/20 0130), Bowel and/or bladder incontinence post epidural: No (10/12/20 0130)  Lannie Fields

## 2020-10-13 DIAGNOSIS — Z3490 Encounter for supervision of normal pregnancy, unspecified, unspecified trimester: Secondary | ICD-10-CM | POA: Diagnosis present

## 2020-10-13 DIAGNOSIS — O368390 Maternal care for abnormalities of the fetal heart rate or rhythm, unspecified trimester, not applicable or unspecified: Secondary | ICD-10-CM | POA: Diagnosis not present

## 2020-10-13 DIAGNOSIS — D62 Acute posthemorrhagic anemia: Secondary | ICD-10-CM | POA: Diagnosis not present

## 2020-10-13 DIAGNOSIS — Z98891 History of uterine scar from previous surgery: Secondary | ICD-10-CM

## 2020-10-13 NOTE — Progress Notes (Signed)
Subjective: Postpartum Day 2: Cesarean Delivery Patient reports tolerating PO, + flatus, and no problems voiding.    Objective: Vital signs in last 24 hours: Temp:  [98.1 F (36.7 C)-98.7 F (37.1 C)] 98.1 F (36.7 C) (09/29 2200) Pulse Rate:  [76-90] 90 (09/30 0616) Resp:  [16-18] 16 (09/30 0616) BP: (116-135)/(68-78) 135/68 (09/30 0616) SpO2:  [98 %-100 %] 100 % (09/30 0616)  Physical Exam:  General: alert, cooperative, and no distress Lochia: appropriate Uterine Fundus: firm Incision: bandage clean dry and intact  DVT Evaluation: No evidence of DVT seen on physical exam.  Recent Labs    10/12/20 0137 10/12/20 0533  HGB 10.2* 9.6*  HCT 30.6* 29.5*    Assessment/Plan: Status post Cesarean section. Doing well postoperatively.  Continue current care Chronic hypertension. Bp well controlled on Procardia XL 30 mg daily  D/C home 10/13/2020 with follow up in the office in 2 weeks for incision check .  Gerald Leitz 10/13/2020, 1:35 PM

## 2020-10-13 NOTE — Lactation Note (Signed)
This note was copied from a baby's chart. Lactation Consultation Note  Patient Name: Ashley Pham HENID'P Date: 10/13/2020 Reason for consult: Follow-up assessment;Mother's request;Difficult latch;Early term 37-38.6wks;Infant < 6lbs Age:38 hours  Infant not able to latch given size of nipple and small mouth. LC reviewed with Mom increasing volume of feeding 30 ml or more since strictly bottle feeding.  Mom also encouraged to use DEBP q 3 hrs for 15 min to maintain her milk supply.   Mom getting small amounts of colostrum with pumping. Mom aware to offer colostrum before DBM.  Mom denies any pain with use of 24 flanges. Mom given coconut oil for nipple care.   LC reviewed behavior to reduce calorie loss for LPTI including keeping hat on all times and keeping total feeding under 30 min.  All questions answered at the end of the visit.   Maternal Data    Feeding Mother's Current Feeding Choice: Breast Milk and Donor Milk Nipple Type: Slow - flow  LATCH Score                    Lactation Tools Discussed/Used Tools: Pump;Flanges Flange Size: 24 Breast pump type: Double-Electric Breast Pump Pump Education: Setup, frequency, and cleaning;Milk Storage Reason for Pumping: increase stimulation Pumping frequency: every 3 hrs for 15 min  Interventions Interventions: Breast feeding basics reviewed;Assisted with latch;Coconut oil;DEBP;Education;Visual merchandiser education  Discharge    Consult Status Consult Status: Follow-up Date: 10/14/20 Follow-up type: In-patient    Matson Welch  Nicholson-Springer 10/13/2020, 9:49 PM

## 2020-10-14 MED ORDER — OXYCODONE HCL 5 MG PO TABS: 5.0000 mg | ORAL_TABLET | ORAL | 0 refills | Status: DC | PRN

## 2020-10-14 MED ORDER — FERROUS SULFATE 325 (65 FE) MG PO TABS: 325.0000 mg | ORAL_TABLET | Freq: Two times a day (BID) | ORAL | 3 refills | Status: DC

## 2020-10-14 MED ORDER — NIFEDIPINE ER 30 MG PO TB24: 30.0000 mg | ORAL_TABLET | Freq: Every day | ORAL | 3 refills | Status: DC

## 2020-10-14 MED ORDER — IBUPROFEN 600 MG PO TABS: 600.0000 mg | ORAL_TABLET | Freq: Four times a day (QID) | ORAL | 0 refills | Status: DC

## 2020-10-14 NOTE — Lactation Note (Signed)
This note was copied from a baby's chart. Lactation Consultation Note  Patient Name: Ashley Pham [id number] Date: 10/14/2020 Reason for consult: Follow-up assessment;Early term 37-38.6wks Age:38 hours  LC in to room prior to discharge. Baby is skin to skin upon arrival. Parent reports good attempts to latch and bottlefeeding effectively. Discussed normal behavior and patterns, voids and stools as signs good intake, pumping, clusterfeeding, skin to skin. Talked about milk coming into volume and managing engorgement.  Reviewed manual pump use. Reinforced appropriate feeding volume per age.  Plan: 1-Feeding on demand or 8-12 times in 24h period. 2-Hand express/pump as needed for supplementation 3-Encouraged maternal rest, hydration and food intake.   Contact LC as needed for feeds/support/concerns/questions. All questions answered at this time. Reviewed LC brochure.    Maternal Data Has patient been taught Hand Expression?: Yes  Feeding Mother's Current Feeding Choice: Breast Milk and Donor Milk Nipple Type: Slow - flow  Lactation Tools Discussed/Used Flange Size: 24 Breast pump type: Double-Electric Breast Pump;Manual Pump Education: Milk Storage Reason for Pumping: stimulation and supplementation Pumping frequency: as needed  Interventions Interventions: Breast feeding basics reviewed;Skin to skin;Hand express;Breast massage;DEBP;Hand pump;Expressed milk;Education;Pace feeding;LC Services brochure  Discharge Discharge Education: Engorgement and breast care;Warning signs for feeding baby Pump: Personal;Manual;DEBP WIC Program: Yes  Consult Status Consult Status: Complete Date: 10/14/20 Follow-up type: Call as needed    Krysta Bloomfield A Higuera Ancidey 10/14/2020, 12:37 PM
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
## 2020-10-23 ENCOUNTER — Telehealth (HOSPITAL_COMMUNITY): Payer: Self-pay | Admitting: *Deleted

## 2020-10-23 NOTE — Telephone Encounter (Signed)
Patient voiced no questions or concerns regarding her own health. EPDS = 6. Shared that OB is prescribing Zoloft - reports taking medication as prescribed. Agreed for RN to email list of maternal mental health resources. Denied signs / symptoms of high BP. Reported having removed C-Section dressing - no signs of infection reported. Has not called to schedule 2week follow-up appointment with OB. RN encouraged patient to call. Patient stated that she has the phone number. Voiced intent to call to schedule appointment.  Patient voiced no questions or concerns regarding baby at this time. Patient reported infant sleeps in a bassinet on his back. RN reviewed ABCs of safe sleep - patient verbalized understanding. Patient requested RN email information on hospital's virtual postpartum classes and support groups. Email sent. Deforest Hoyles, RN, 10/23/20, [phone].

## 2021-08-02 ENCOUNTER — Encounter: Payer: Self-pay | Admitting: Internal Medicine

## 2021-08-02 ENCOUNTER — Ambulatory Visit: Payer: BC Managed Care – PPO | Admitting: Internal Medicine

## 2021-08-02 VITALS — BP 118/86 | HR 93 | Temp 98.2°F | Resp 16 | Ht 63.0 in | Wt 261.0 lb

## 2021-08-02 DIAGNOSIS — I10 Essential (primary) hypertension: Secondary | ICD-10-CM | POA: Diagnosis not present

## 2021-08-02 DIAGNOSIS — R635 Abnormal weight gain: Secondary | ICD-10-CM | POA: Diagnosis not present

## 2021-08-02 DIAGNOSIS — N920 Excessive and frequent menstruation with regular cycle: Secondary | ICD-10-CM | POA: Insufficient documentation

## 2021-08-02 DIAGNOSIS — D539 Nutritional anemia, unspecified: Secondary | ICD-10-CM | POA: Insufficient documentation

## 2021-08-02 DIAGNOSIS — F331 Major depressive disorder, recurrent, moderate: Secondary | ICD-10-CM | POA: Diagnosis not present

## 2021-08-02 LAB — CBC WITH DIFFERENTIAL/PLATELET
Basophils Absolute: 0 10*3/uL (ref 0.0–0.1)
Basophils Relative: 0.1 % (ref 0.0–3.0)
Eosinophils Absolute: 0 10*3/uL (ref 0.0–0.7)
Eosinophils Relative: 0.6 % (ref 0.0–5.0)
HCT: 37.7 % (ref 36.0–46.0)
Hemoglobin: 12.3 g/dL (ref 12.0–15.0)
Lymphocytes Relative: 22.3 % (ref 12.0–46.0)
Lymphs Abs: 1.4 10*3/uL (ref 0.7–4.0)
MCHC: 32.5 g/dL (ref 30.0–36.0)
MCV: 78.1 fl (ref 78.0–100.0)
Monocytes Absolute: 0.2 10*3/uL (ref 0.1–1.0)
Monocytes Relative: 3.9 % (ref 3.0–12.0)
Neutro Abs: 4.6 10*3/uL (ref 1.4–7.7)
Neutrophils Relative %: 73.1 % (ref 43.0–77.0)
Platelets: 236 10*3/uL (ref 150.0–400.0)
RBC: 4.83 Mil/uL (ref 3.87–5.11)
RDW: 15.8 % — ABNORMAL HIGH (ref 11.5–15.5)
WBC: 6.3 10*3/uL (ref 4.0–10.5)

## 2021-08-02 LAB — BASIC METABOLIC PANEL
BUN: 10 mg/dL (ref 6–23)
CO2: 27 mEq/L (ref 19–32)
Calcium: 8.5 mg/dL (ref 8.4–10.5)
Chloride: 104 mEq/L (ref 96–112)
Creatinine, Ser: 0.8 mg/dL (ref 0.40–1.20)
GFR: 93.2 mL/min (ref >60.00)
Glucose, Bld: 90 mg/dL (ref 70–99)
Potassium: 4 mEq/L (ref 3.5–5.1)
Sodium: 138 mEq/L (ref 135–145)

## 2021-08-02 LAB — IBC + FERRITIN
Ferritin: 20.7 ng/mL (ref 10.0–291.0)
Iron: 128 ug/dL (ref 42–145)
Saturation Ratios: 39.2 % (ref 20.0–50.0)
TIBC: 326.2 ug/dL (ref 250.0–450.0)
Transferrin: 233 mg/dL (ref 212.0–360.0)

## 2021-08-02 LAB — TSH: TSH: 1.19 u[IU]/mL (ref 0.35–5.50)

## 2021-08-02 MED ORDER — VILAZODONE HCL 10 MG PO TABS: 10.0000 mg | ORAL_TABLET | Freq: Every day | ORAL | 0 refills | Status: DC

## 2021-08-02 NOTE — Patient Instructions (Signed)
Anemia  Anemia is a condition in which there is not enough red blood cells or hemoglobin in the blood. Hemoglobin is a substance in red blood cells that carries oxygen. When you do not have enough red blood cells or hemoglobin (are anemic), your body cannot get enough oxygen and your organs may not work properly. As a result, you may feel very tired or have other problems. What are the causes? Common causes of anemia include: Excessive bleeding. Anemia can be caused by excessive bleeding inside or outside the body, including bleeding from the intestines or from heavy menstrual periods in females. Poor nutrition. Long-lasting (chronic) kidney, thyroid, and liver disease. Bone marrow disorders, spleen problems, and blood disorders. Cancer and treatments for cancer. HIV (human immunodeficiency virus) and AIDS (acquired immunodeficiency syndrome). Infections, medicines, and autoimmune disorders that destroy red blood cells. What are the signs or symptoms? Symptoms of this condition include: Minor weakness. Dizziness. Headache, or difficulties concentrating and sleeping. Heartbeats that feel irregular or faster than normal (palpitations). Shortness of breath, especially with exercise. Pale skin, lips, and nails, or cold hands and feet. Indigestion and nausea. Symptoms may occur suddenly or develop slowly. If your anemia is mild, you may not have symptoms. How is this diagnosed? This condition is diagnosed based on blood tests, your medical history, and a physical exam. In some cases, a test may be needed in which cells are removed from the soft tissue inside of a bone and looked at under a microscope (bone marrow biopsy). Your health care provider may also check your stool (feces) for blood and may do additional testing to look for the cause of your bleeding. Other tests may include: Imaging tests, such as a CT scan or MRI. A procedure to see inside your esophagus and stomach (endoscopy). A  procedure to see inside your colon and rectum (colonoscopy). How is this treated? Treatment for this condition depends on the cause. If you continue to lose a lot of blood, you may need to be treated at a hospital. Treatment may include: Taking supplements of iron, vitamin B12, or folic acid. Taking a hormone medicine (erythropoietin) that can help to stimulate red blood cell growth. Having a blood transfusion. This may be needed if you lose a lot of blood. Making changes to your diet. Having surgery to remove your spleen. Follow these instructions at home: Take over-the-counter and prescription medicines only as told by your health care provider. Take supplements only as told by your health care provider. Follow any diet instructions that you were given by your health care provider. Keep all follow-up visits as told by your health care provider. This is important. Contact a health care provider if: You develop new bleeding anywhere in the body. Get help right away if: You are very weak. You are short of breath. You have pain in your abdomen or chest. You are dizzy or feel faint. You have trouble concentrating. You have bloody stools, black stools, or tarry stools. You vomit repeatedly or you vomit up blood. These symptoms may represent a serious problem that is an emergency. Do not wait to see if the symptoms will go away. Get medical help right away. Call your local emergency services (911 in the [place] Do not drive yourself to the hospital. Summary Anemia is a condition in which you do not have enough red blood cells or enough of a substance in your red blood cells that carries oxygen (hemoglobin). Symptoms may occur suddenly or develop slowly. If your anemia   is mild, you may not have symptoms. This condition is diagnosed with blood tests, a medical history, and a physical exam. Other tests may be needed. Treatment for this condition depends on the cause of the anemia. This  information is not intended to replace advice given to you by your health care provider. Make sure you discuss any questions you have with your health care provider. Document Revised: 11/14/2020 Document Reviewed: 12/08/2018 Elsevier Patient Education  2023 Elsevier Inc.  

## 2021-08-02 NOTE — Progress Notes (Signed)
Subjective:  Patient ID: Ashley Pham, female    DOB: 1980/11/10  Age: 41 y.o. MRN: [id number]  CC: Anemia and Hypertension   HPI [name] presents for establishing.  She has a history of depression and is not currently being treated.  She tried sertraline but did not like it.  She complains of a several month history of fatigue, ruminations about death, irritability, crying spells, increased appetite and weight, feeling anxious and having insomnia.  She denies SI or HI.  History Sharis has a past medical history of Anxiety, Depression, Elevated troponin (03/11/2015), Excessive daytime sleepiness (04/21/2015), Hypertension, LFT elevation, Morbid obesity (HCC), Syncope and collapse (03/11/2015), and Traumatic rhabdomyolysis (HCC).   She has a past surgical history that includes sweat gland removal; Wisdom tooth extraction; Split night study (06/19/2015); and Cesarean section (N/A, 10/11/2020).   Her family history includes Diabetes in her father; Heart disease in her father; Heart murmur in her mother; Hypertension in her father.She reports that she has quit smoking. Her smoking use included cigarettes. She smoked an average of .5 packs per day. She has never been exposed to tobacco smoke. She has quit using smokeless tobacco. She reports that she does not currently use drugs. She reports that she does not drink alcohol.  Outpatient Medications Prior to Visit  Medication Sig Dispense Refill   ferrous sulfate 325 (65 FE) MG tablet Take 1 tablet (325 mg total) by mouth 2 (two) times daily with a meal. 30 tablet 3   NIFEdipine (ADALAT CC) 30 MG 24 hr tablet Take 1 tablet (30 mg total) by mouth daily. 30 tablet 3   oxyCODONE (OXY IR/ROXICODONE) 5 MG immediate release tablet Take 1 tablet (5 mg total) by mouth every 4 (four) hours as needed for moderate pain. 20 tablet 0   ibuprofen (ADVIL) 600 MG tablet Take 1 tablet (600 mg total) by mouth every 6 (six) hours. 30 tablet 0   sertraline (ZOLOFT)  50 MG tablet Take 50 mg by mouth daily.     No facility-administered medications prior to visit.    ROS Review of Systems  Constitutional:  Positive for appetite change, fatigue and unexpected weight change. Negative for chills and diaphoresis.  HENT: Negative.    Eyes: Negative.   Respiratory:  Negative for cough, chest tightness, shortness of breath and wheezing.   Cardiovascular:  Negative for chest pain, palpitations and leg swelling.  Gastrointestinal:  Negative for abdominal pain, constipation, diarrhea and vomiting.  Endocrine: Negative.   Genitourinary:  Positive for menstrual problem (long,heavy periods).  Musculoskeletal: Negative.  Negative for arthralgias.  Skin: Negative.   Neurological: Negative.   Hematological: Negative.   Psychiatric/Behavioral:  Positive for dysphoric mood and sleep disturbance. Negative for confusion, decreased concentration and suicidal ideas. The patient is nervous/anxious.     Objective:  BP 118/86 (BP Location: Left Arm, Patient Position: Sitting, Cuff Size: Large)   Pulse 93   Temp 98.2 F (36.8 C) (Oral)   Resp 16   Ht 5\' 3"  (1.6 m)   Wt 261 lb (118.4 kg)   LMP 07/24/2021 (Exact Date)   SpO2 94%   Breastfeeding No   BMI 46.23 kg/m   Physical Exam Vitals reviewed.  Constitutional:      Appearance: She is obese.  HENT:     Nose: Nose normal.     Mouth/Throat:     Mouth: Mucous membranes are moist.  Eyes:     General: No scleral icterus.    Conjunctiva/sclera: Conjunctivae  normal.  Cardiovascular:     Rate and Rhythm: Normal rate and regular rhythm.     Heart sounds: No murmur heard. Pulmonary:     Effort: Pulmonary effort is normal.     Breath sounds: No stridor. No wheezing, rhonchi or rales.  Abdominal:     General: Abdomen is protuberant.     Palpations: There is no mass.     Tenderness: There is no abdominal tenderness. There is no guarding.     Hernia: No hernia is present.  Musculoskeletal:        General: Normal  range of motion.     Cervical back: Neck supple.     Right lower leg: No edema.     Left lower leg: No edema.  Lymphadenopathy:     Cervical: No cervical adenopathy.  Skin:    General: Skin is warm and dry.  Neurological:     General: No focal deficit present.  Psychiatric:        Attention and Perception: Attention and perception normal.        Mood and Affect: Mood is depressed. Mood is not anxious. Affect is flat.        Speech: Speech normal. She is communicative. Speech is not delayed.        Behavior: Behavior normal. Behavior is not agitated, aggressive or hyperactive.        Thought Content: Thought content normal. Thought content is not paranoid or delusional. Thought content does not include homicidal or suicidal ideation.     Lab Results  Component Value Date   WBC 6.3 08/02/2021   HGB 12.3 08/02/2021   HCT 37.7 08/02/2021   PLT 236.0 08/02/2021   GLUCOSE 90 08/02/2021   ALT 13 10/11/2020   AST 23 10/11/2020   NA 138 08/02/2021   K 4.0 08/02/2021   CL 104 08/02/2021   CREATININE 0.80 08/02/2021   BUN 10 08/02/2021   CO2 27 08/02/2021   TSH 1.19 08/02/2021      Assessment & Plan:   Jimmye was seen today for anemia and hypertension.  Diagnoses and all orders for this visit:  Moderate episode of recurrent major depressive disorder (HCC) -     Vilazodone HCl (VIIBRYD) 10 MG TABS; Take 1 tablet (10 mg total) by mouth daily. -     TSH; Future -     TSH  Abnormal weight gain- Labs are negative for secondary causes. 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 -     IBC + Ferritin; Future -     CBC with Differential/Platelet; Future -     CBC with Differential/Platelet -     IBC + Ferritin  Primary hypertension- Her blood pressure is well controlled.  Antihypertensive therapy is not indicated. -     Basic metabolic panel; Future -     TSH; Future -     TSH -     Basic metabolic panel  Menorrhagia with regular cycle -      Ambulatory referral to Gynecology   I have discontinued Mozell L. Ritson's sertraline and ibuprofen. I am also having her start on Vilazodone HCl. Additionally, I am having her maintain her ferrous sulfate, NIFEdipine, and oxyCODONE.  Meds ordered this encounter  Medications   Vilazodone HCl (VIIBRYD) 10 MG TABS    Sig: Take 1 tablet (10 mg total) by mouth daily.    Dispense:  7 tablet  Refill:  0     Follow-up: Return in about 3 months (around 11/02/2021).  Scarlette Calico, MD

## 2021-08-05 ENCOUNTER — Encounter: Payer: Self-pay | Admitting: Internal Medicine

## 2021-08-08 ENCOUNTER — Other Ambulatory Visit: Payer: Self-pay | Admitting: Internal Medicine

## 2021-08-08 DIAGNOSIS — F331 Major depressive disorder, recurrent, moderate: Secondary | ICD-10-CM

## 2021-08-08 MED ORDER — VILAZODONE HCL 20 MG PO TABS: 1.0000 | ORAL_TABLET | Freq: Every day | ORAL | 0 refills | Status: DC

## 2021-08-10 ENCOUNTER — Encounter: Payer: Self-pay | Admitting: Internal Medicine

## 2021-09-05 ENCOUNTER — Other Ambulatory Visit: Payer: Self-pay | Admitting: Internal Medicine

## 2021-09-05 DIAGNOSIS — F331 Major depressive disorder, recurrent, moderate: Secondary | ICD-10-CM

## 2021-09-05 MED ORDER — VILAZODONE HCL 40 MG PO TABS: 40.0000 mg | ORAL_TABLET | Freq: Every day | ORAL | 0 refills | Status: DC

## 2021-09-07 ENCOUNTER — Other Ambulatory Visit: Payer: Self-pay | Admitting: Internal Medicine

## 2021-09-07 DIAGNOSIS — F331 Major depressive disorder, recurrent, moderate: Secondary | ICD-10-CM

## 2021-09-07 MED ORDER — VILAZODONE HCL 40 MG PO TABS: 40.0000 mg | ORAL_TABLET | Freq: Every day | ORAL | 0 refills | Status: DC

## 2021-10-30 ENCOUNTER — Telehealth: Payer: Self-pay

## 2021-11-05 ENCOUNTER — Ambulatory Visit (INDEPENDENT_AMBULATORY_CARE_PROVIDER_SITE_OTHER): Payer: BC Managed Care – PPO | Admitting: Obstetrics and Gynecology

## 2021-11-05 ENCOUNTER — Other Ambulatory Visit (HOSPITAL_COMMUNITY)
Admission: RE | Admit: 2021-11-05 | Discharge: 2021-11-05 | Disposition: A | Discharge status: Home / Self Care | Payer: BC Managed Care – PPO | Source: Ambulatory Visit | Attending: Obstetrics and Gynecology | Admitting: Obstetrics and Gynecology

## 2021-11-05 ENCOUNTER — Encounter: Payer: Self-pay | Admitting: Obstetrics and Gynecology

## 2021-11-05 VITALS — BP 121/78 | HR 88 | Ht 63.0 in | Wt 268.0 lb

## 2021-11-05 DIAGNOSIS — Z01411 Encounter for gynecological examination (general) (routine) with abnormal findings: Secondary | ICD-10-CM

## 2021-11-05 NOTE — Progress Notes (Signed)
Subjective:     Ashley Pham is a 41 y.o. female P2 with LMP 10/12/21 and BMI 47 who is here for a comprehensive physical exam. The patient reports no problems. She reports a monthly period lasting 4-5 days. She is sexually active without contraception. She denies pelvic pain or abnormal discharge. She reports recurrent boils in groin region and under her arms. Patient is requesting STI screening. She is not interested in contraception at this time  Past Medical History:  Diagnosis Date   Anemia    Anxiety    Depression    Dyspnea    Elevated troponin 03/11/2015   Excessive daytime sleepiness 04/21/2015   Headache    Hypertension    LFT elevation    Morbid obesity (HCC)    Syncope and collapse 03/11/2015   Traumatic rhabdomyolysis (HCC)    Vaginal Pap smear, abnormal    Past Surgical History:  Procedure Laterality Date   CESAREAN SECTION N/A 10/11/2020   Procedure: CESAREAN SECTION;  Surgeon: Gerald Leitz, MD;  Location: [facility] ORS;  Service: Obstetrics;  Laterality: N/A;   SPLIT NIGHT STUDY  06/19/2015   sweat gland removal     WISDOM TOOTH EXTRACTION     Family History  Problem Relation Age of Onset   Heart murmur Mother    COPD Mother    Hypertension Father    Heart disease Father    Diabetes Father    Hypertension Brother    Cancer Paternal Grandfather     Social History   Socioeconomic History   Marital status: Single    Spouse name: Not on file   Number of children: 2   Years of education: Not on file   Highest education level: Not on file  Occupational History   Not on file  Tobacco Use   Smoking status: Former    Packs/day: 0.50    Types: Cigarettes    Passive exposure: Never   Smokeless tobacco: Former  Building services engineer Use: Never used  Substance and Sexual Activity   Alcohol use: No   Drug use: Not Currently   Sexual activity: Yes    Partners: Male    Birth control/protection: Other-see comments    Comment: unsure  Other Topics Concern   Not on  file  Social History Narrative   Not on file   Social Determinants of Health   Financial Resource Strain: Not on file  Food Insecurity: Not on file  Transportation Needs: Not on file  Physical Activity: Not on file  Stress: Not on file  Social Connections: Not on file  Intimate Partner Violence: Not on file   Health Maintenance  Topic Date Due   COVID-19 Vaccine (1) Never done   PAP SMEAR-Modifier  Never done   INFLUENZA VACCINE  08/14/2021   TETANUS/TDAP  03/10/2025   Hepatitis C Screening  Completed   HIV Screening  Completed   HPV VACCINES  Aged Out       Review of Systems Pertinent items noted in HPI and remainder of comprehensive ROS otherwise negative.   Objective:  Blood pressure 121/78, pulse 88, height 5\' 3"  (1.6 m), weight 268 lb (121.6 kg), last menstrual period 10/12/2021, not currently breastfeeding.   GENERAL: Well-developed, well-nourished female in no acute distress.  HEENT: Normocephalic, atraumatic. Sclerae anicteric.  NECK: Supple. Normal thyroid.  LUNGS: Clear to auscultation bilaterally.  HEART: Regular rate and rhythm. BREASTS: Symmetric in size. No palpable masses or lymphadenopathy, skin changes, or nipple drainage.  ABDOMEN: Soft, nontender, nondistended. No organomegaly. PELVIC: Normal external female genitalia. Vagina is pink and rugated.  Normal discharge. Normal appearing cervix. Uterus is normal in size. No adnexal mass or tenderness. Chaperone present during the pelvic exam EXTREMITIES: No cyanosis, clubbing, or edema, 2+ distal pulses.     Assessment:    Healthy female exam.     Plan:    Pap smear collected  STI screening per patient request Patient will be contacted with abnormal results Perineal care instructions provided to help decrease the risk of recurrent boils See After Visit Summary for Counseling Recommendations

## 2021-11-05 NOTE — Progress Notes (Signed)
41 y.o New GYN referral from  [facility] presents for AEX/PAP.  Pt. Stated that her periods are lighter now.

## 2021-11-06 LAB — CERVICOVAGINAL ANCILLARY ONLY
Chlamydia: NEGATIVE
Comment: NEGATIVE
Comment: NORMAL
Neisseria Gonorrhea: NEGATIVE

## 2021-11-07 ENCOUNTER — Other Ambulatory Visit: Payer: BC Managed Care – PPO

## 2021-11-08 LAB — HIV ANTIBODY (ROUTINE TESTING W REFLEX): HIV Screen 4th Generation wRfx: NONREACTIVE

## 2021-11-08 LAB — HEPATITIS C ANTIBODY: Hep C Virus Ab: NONREACTIVE

## 2021-11-08 LAB — RPR: RPR Ser Ql: NONREACTIVE

## 2021-11-08 LAB — HEPATITIS B SURFACE ANTIGEN: Hepatitis B Surface Ag: NEGATIVE

## 2021-11-09 LAB — CYTOLOGY - PAP
Comment: NEGATIVE
Comment: NEGATIVE
Comment: NEGATIVE
Diagnosis: NEGATIVE
Diagnosis: REACTIVE
HPV 16: NEGATIVE
HPV 18 / 45: NEGATIVE
High risk HPV: POSITIVE — AB

## 2021-11-12 ENCOUNTER — Ambulatory Visit
Admission: RE | Admit: 2021-11-12 | Discharge: 2021-11-12 | Disposition: A | Discharge status: Home / Self Care | Payer: BC Managed Care – PPO | Source: Ambulatory Visit | Attending: Obstetrics and Gynecology | Admitting: Obstetrics and Gynecology

## 2021-11-12 DIAGNOSIS — Z01411 Encounter for gynecological examination (general) (routine) with abnormal findings: Secondary | ICD-10-CM | POA: Insufficient documentation

## 2021-12-07 ENCOUNTER — Other Ambulatory Visit: Payer: Self-pay | Admitting: Internal Medicine

## 2021-12-07 DIAGNOSIS — F331 Major depressive disorder, recurrent, moderate: Secondary | ICD-10-CM

## 2022-03-18 ENCOUNTER — Other Ambulatory Visit: Payer: Self-pay | Admitting: Internal Medicine

## 2022-03-18 DIAGNOSIS — F331 Major depressive disorder, recurrent, moderate: Secondary | ICD-10-CM

## 2022-06-26 ENCOUNTER — Other Ambulatory Visit: Payer: Self-pay | Admitting: Internal Medicine

## 2022-06-26 DIAGNOSIS — F331 Major depressive disorder, recurrent, moderate: Secondary | ICD-10-CM

## 2022-08-05 ENCOUNTER — Other Ambulatory Visit: Payer: Self-pay | Admitting: Internal Medicine

## 2022-08-05 DIAGNOSIS — F331 Major depressive disorder, recurrent, moderate: Secondary | ICD-10-CM

## 2022-09-02 ENCOUNTER — Other Ambulatory Visit: Payer: Self-pay | Admitting: Internal Medicine

## 2022-09-02 DIAGNOSIS — F331 Major depressive disorder, recurrent, moderate: Secondary | ICD-10-CM

## 2022-09-14 ENCOUNTER — Other Ambulatory Visit: Payer: Self-pay | Admitting: Internal Medicine

## 2022-09-14 DIAGNOSIS — F331 Major depressive disorder, recurrent, moderate: Secondary | ICD-10-CM

## 2022-09-17 ENCOUNTER — Other Ambulatory Visit: Payer: Self-pay | Admitting: Internal Medicine

## 2022-09-17 ENCOUNTER — Telehealth: Payer: Self-pay | Admitting: Internal Medicine

## 2022-09-17 DIAGNOSIS — F331 Major depressive disorder, recurrent, moderate: Secondary | ICD-10-CM

## 2022-09-17 MED ORDER — VILAZODONE HCL 40 MG PO TABS: 40.0000 mg | ORAL_TABLET | Freq: Every day | ORAL | 0 refills | Status: DC

## 2022-09-17 NOTE — Telephone Encounter (Signed)
Pt has been informed Rx was sent and encouraged to keep 9/25 OV.

## 2022-09-17 NOTE — Telephone Encounter (Signed)
Patient said she has been out of her Vilazodone HCl (VIIBRYD) 40 MG TABS for two weeks. She said she is struggling and would like to know what she can do until her appointment on 10/09/2022. Patient would like a call back at [phone].

## 2022-10-09 ENCOUNTER — Ambulatory Visit (INDEPENDENT_AMBULATORY_CARE_PROVIDER_SITE_OTHER): Payer: BC Managed Care – PPO

## 2022-10-09 ENCOUNTER — Ambulatory Visit: Payer: BC Managed Care – PPO | Admitting: Internal Medicine

## 2022-10-09 VITALS — BP 122/82 | HR 100 | Temp 98.0°F | Resp 16 | Ht 63.0 in | Wt 258.0 lb

## 2022-10-09 DIAGNOSIS — M542 Cervicalgia: Secondary | ICD-10-CM

## 2022-10-09 DIAGNOSIS — G4719 Other hypersomnia: Secondary | ICD-10-CM | POA: Diagnosis not present

## 2022-10-09 DIAGNOSIS — M545 Low back pain, unspecified: Secondary | ICD-10-CM

## 2022-10-09 DIAGNOSIS — R0683 Snoring: Secondary | ICD-10-CM

## 2022-10-09 DIAGNOSIS — G8929 Other chronic pain: Secondary | ICD-10-CM

## 2022-10-09 DIAGNOSIS — F331 Major depressive disorder, recurrent, moderate: Secondary | ICD-10-CM | POA: Diagnosis not present

## 2022-10-09 NOTE — Patient Instructions (Signed)
Managing Chronic Back Pain Chronic back pain is pain that lasts longer than 3 months. It often affects the lower back. It may feel like a muscle ache or a sharp, stabbing pain. It can be mild, moderate, or severe. There are things you can do to help manage your pain. See what works best for you. Your health care provider may also give you other instructions. What actions can I take to manage my chronic back pain? You may be given a treatment plan by your provider. Treatment often starts with rest and pain relief. It may also include: Physical therapy. These are exercises to help restore movement and strength to your back. Techniques to help you relax. Counseling or therapy. Cognitive behavioral therapy (CBT) is a form of therapy that helps you set goals and make changes. Acupuncture or massage therapy. Local electrical stimulation. Injections. You may be given medicines to numb an area or relieve pain. If other treatments do not help, you may need surgery. How to use body mechanics and posture to help with pain You can help relieve stress on your back with good posture and healthy body mechanics. Body mechanics are all the ways your body moves during the day. Posture is part of body mechanics. Good posture means: Your spine is in its correct S-curve, or neutral, position. Your shoulders are pulled back a bit. Your head is not tipped forward. To improve your posture and body mechanics, follow these guidelines. Standing  When standing, keep your feet about hip-width apart. Keep your knees slightly bent. Your ears, shoulders, and hips should line up. Your spine should be neutral. When you stand in one place for a long time, place one foot on a stable object that is 2-4 inches (5-10 cm) high, such as a footstool. Sitting  When sitting, keep your feet flat on the floor. Use a footrest, if needed. Keep your thighs parallel to the floor. Try not to round your shoulders or tilt your head  forward. When working at a desk or a computer: Position your desk so your hands are a little lower than your elbows. Slide your chair under your desk so you are close enough to have good posture. Position your monitor so you are looking straight ahead and do not have to tilt your head to view the screen. Lifting  Keep your feet shoulder-width apart. Tighten the muscles of your abdomen. Bend your knees and hips. Keep your spine neutral. Lift using the strength of your legs, not your back. Do not lock your knees straight out. Ask for help to lift heavy or awkward objects. Resting  Do not lie down in a way that causes pain. If you have pain when you sit, bend, stoop, or squat, lie in a way that your body does not bend much. Try not to curl up on your side with your arms and knees near your chest (fetal position). If it hurts to stand for a long time or reach with your arms, lie with your spine neutral and knees bent slightly. Try lying: On your side with a pillow between your knees. On your back with a pillow under your knees. How to recognize changes in your chronic back pain Let your provider know if your pain gets worse or does not get better with treatment. Your back pain may be getting worse if you have pain that: Starts to cause problems with your posture. Gets worse when you sit, stand, walk, bend, or lift things. Happens when you are active,  at rest, or both. Makes it hard for you to move around (limits mobility). Occurs with fever, weight loss, or trouble peeing (urinating). Causes numbness and tingling. Follow these instructions at home: Medicines You may need to take medicines for pain and inflammation. These may be taken by mouth or put on the skin. You may also be given muscle relaxants. Take over-the-counter and prescription medicines only as told by your provider. Ask your provider if the medicine prescribed to you: Requires you to avoid driving or using machinery. Can  cause constipation. You may need to take these actions to prevent or treat constipation: Drink enough fluid to keep your pee (urine) pale yellow. Take over-the-counter or prescription medicines. Eat foods that are high in fiber, such as beans, whole grains, and fresh fruits and vegetables. Limit foods that are high in fat and processed sugars, such as fried or sweet foods. Lifestyle Do not use any products that contain nicotine or tobacco. These products include cigarettes, chewing tobacco, and vaping devices, such as e-cigarettes. If you need help quitting, ask your provider. Eat a healthy diet. Eat lots of vegetables, fruits, fish, and lean meats. Work with your provider to stay at a healthy weight. General instructions Get regular exercise as told. Exercise can help with flexibility and strength. If physical therapy was prescribed, do exercises as told by your provider. Use ice or heat therapy as told by your provider. Where can I get support? Think about joining a support group for people with chronic back pain. You can find some groups at: Pain Connection Program: painconnection.org The American Chronic Pain Association: acpanow.com Contact a health care provider if: Your pain does not get better with rest or medicine. You have new pain. You have a fever. You lose weight quickly. You have trouble doing your normal activities. You feel weak or numb in one or both of your legs or feet. Get help right away if: You are not able to control when you pee or poop. You have severe back pain and: Nausea or vomiting. Pain in your chest or abdomen. Shortness of breath. You faint. These symptoms may be an emergency. Get help right away. Call 911. Do not wait to see if the symptoms will go away. Do not drive yourself to the hospital. This information is not intended to replace advice given to you by your health care provider. Make sure you discuss any questions you have with your health care  provider. Document Revised: 08/20/2021 Document Reviewed: 08/20/2021 Elsevier Patient Education  2024 [employer].

## 2022-10-10 LAB — CBC WITH DIFFERENTIAL/PLATELET
Basophils Absolute: 0.1 10*3/uL (ref 0.0–0.1)
Basophils Relative: 0.7 % (ref 0.0–3.0)
Eosinophils Absolute: 0 10*3/uL (ref 0.0–0.7)
Eosinophils Relative: 0.5 % (ref 0.0–5.0)
HCT: 37.8 % (ref 36.0–46.0)
Hemoglobin: 12.2 g/dL (ref 12.0–15.0)
Lymphocytes Relative: 24.8 % (ref 12.0–46.0)
Lymphs Abs: 2.4 10*3/uL (ref 0.7–4.0)
MCHC: 32.4 g/dL (ref 30.0–36.0)
MCV: 80.4 fl (ref 78.0–100.0)
Monocytes Absolute: 0.5 10*3/uL (ref 0.1–1.0)
Monocytes Relative: 5.2 % (ref 3.0–12.0)
Neutro Abs: 6.7 10*3/uL (ref 1.4–7.7)
Neutrophils Relative %: 68.8 % (ref 43.0–77.0)
Platelets: 250 10*3/uL (ref 150.0–400.0)
RBC: 4.7 Mil/uL (ref 3.87–5.11)
RDW: 14.5 % (ref 11.5–15.5)
WBC: 9.8 10*3/uL (ref 4.0–10.5)

## 2022-10-10 LAB — BASIC METABOLIC PANEL
BUN: 11 mg/dL (ref 6–23)
CO2: 26 mEq/L (ref 19–32)
Calcium: 8.8 mg/dL (ref 8.4–10.5)
Chloride: 106 mEq/L (ref 96–112)
Creatinine, Ser: 0.95 mg/dL (ref 0.40–1.20)
GFR: 75.2 mL/min (ref >60.00)
Glucose, Bld: 92 mg/dL (ref 70–99)
Potassium: 3.7 mEq/L (ref 3.5–5.1)
Sodium: 139 mEq/L (ref 135–145)

## 2022-10-10 LAB — HEPATIC FUNCTION PANEL
ALT: 7 U/L (ref 0–35)
AST: 14 U/L (ref 0–37)
Albumin: 4 g/dL (ref 3.5–5.2)
Alkaline Phosphatase: 48 U/L (ref 39–117)
Bilirubin, Direct: 0 mg/dL (ref 0.0–0.3)
Total Bilirubin: 0.3 mg/dL (ref 0.2–1.2)
Total Protein: 7.4 g/dL (ref 6.0–8.3)

## 2022-10-10 LAB — TRIGLYCERIDES: Triglycerides: 111 mg/dL (ref 0.0–149.0)

## 2022-10-10 LAB — TSH: TSH: 0.49 u[IU]/mL (ref 0.35–5.50)

## 2022-10-10 LAB — HEMOGLOBIN A1C: Hgb A1c MFr Bld: 5.6 % (ref 4.6–6.5)

## 2022-10-14 ENCOUNTER — Other Ambulatory Visit: Payer: Self-pay | Admitting: Internal Medicine

## 2022-10-14 DIAGNOSIS — F331 Major depressive disorder, recurrent, moderate: Secondary | ICD-10-CM

## 2022-10-14 MED ORDER — VILAZODONE HCL 40 MG PO TABS: 40.0000 mg | ORAL_TABLET | Freq: Every day | ORAL | 1 refills | Status: DC

## 2022-10-30 ENCOUNTER — Other Ambulatory Visit: Payer: Self-pay | Admitting: Internal Medicine

## 2022-10-30 ENCOUNTER — Encounter: Payer: Self-pay | Admitting: Internal Medicine

## 2022-10-30 DIAGNOSIS — M5030 Other cervical disc degeneration, unspecified cervical region: Secondary | ICD-10-CM | POA: Insufficient documentation

## 2022-10-30 DIAGNOSIS — G8929 Other chronic pain: Secondary | ICD-10-CM

## 2022-10-30 DIAGNOSIS — R0683 Snoring: Secondary | ICD-10-CM

## 2022-11-04 ENCOUNTER — Telehealth: Payer: Self-pay | Admitting: Physical Medicine and Rehabilitation

## 2022-11-04 NOTE — Telephone Encounter (Signed)
LVM to return call to clinic.

## 2022-11-04 NOTE — Telephone Encounter (Signed)
Pt called about referral. Referral in chart. Please call pt at [phone].

## 2022-11-07 ENCOUNTER — Other Ambulatory Visit: Payer: Self-pay | Admitting: Orthopaedic Surgery

## 2022-11-07 ENCOUNTER — Ambulatory Visit (INDEPENDENT_AMBULATORY_CARE_PROVIDER_SITE_OTHER): Payer: BC Managed Care – PPO | Admitting: Physical Medicine and Rehabilitation

## 2022-11-07 DIAGNOSIS — M7918 Myalgia, other site: Secondary | ICD-10-CM | POA: Diagnosis not present

## 2022-11-07 DIAGNOSIS — M542 Cervicalgia: Secondary | ICD-10-CM

## 2022-11-07 DIAGNOSIS — M5442 Lumbago with sciatica, left side: Secondary | ICD-10-CM

## 2022-11-07 DIAGNOSIS — G8929 Other chronic pain: Secondary | ICD-10-CM

## 2022-11-07 DIAGNOSIS — M533 Sacrococcygeal disorders, not elsewhere classified: Secondary | ICD-10-CM | POA: Diagnosis not present

## 2022-11-07 MED ORDER — CYCLOBENZAPRINE HCL 10 MG PO TABS: 10.0000 mg | ORAL_TABLET | Freq: Every evening | ORAL | 0 refills | Status: DC | PRN

## 2022-11-07 MED ORDER — MELOXICAM 15 MG PO TABS: 15.0000 mg | ORAL_TABLET | Freq: Every day | ORAL | 0 refills | Status: DC
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
## 2022-11-07 NOTE — Progress Notes (Signed)
Neck and back pain for a few years now.  Things got worse after she had her second child sept 28 of 2022.   Unable to sleep comfortably at night. Wakes with a headache at times or the whole left side starts to tingle.   Otc tylenol arthritis 600 mg 3 at at time they do help some but done take the pain completely away.   +numbness and tingling that travels down her arms   +weakness in her L foot along with numbness on her L side.  Sitting for a long period of time causes a lot of pain she states she has to rock to get up and get moving due to the pain in her buttock area.

## 2022-11-12 ENCOUNTER — Telehealth: Payer: Self-pay | Admitting: Physical Medicine and Rehabilitation

## 2022-11-12 NOTE — Telephone Encounter (Signed)
Patient called returning your call. CB#[phone]

## 2022-11-13 ENCOUNTER — Encounter: Payer: Self-pay | Admitting: Physical Medicine and Rehabilitation

## 2022-11-21 ENCOUNTER — Other Ambulatory Visit: Payer: Self-pay

## 2022-11-21 ENCOUNTER — Ambulatory Visit (INDEPENDENT_AMBULATORY_CARE_PROVIDER_SITE_OTHER): Payer: BC Managed Care – PPO | Admitting: Physical Medicine and Rehabilitation

## 2022-11-21 DIAGNOSIS — M461 Sacroiliitis, not elsewhere classified: Secondary | ICD-10-CM | POA: Diagnosis not present

## 2022-11-21 NOTE — Progress Notes (Signed)
KESIAH Pham - 42 y.o. female MRN [id number]  Date of birth: 1980-10-11  Office Visit Note: Visit Date: 11/21/2022 PCP: Etta Grandchild, MD Referred by: Etta Grandchild, MD  Subjective: No chief complaint on file.  HPI:  Ashley Pham is a 42 y.o. female who comes in today at the request of Ellin Goodie, FNP for planned Left anesthetic Sacroiliac joint arthrogram with fluoroscopic guidance.  The patient has failed conservative care including home exercise, medications, time and activity modification.  This injection will be diagnostic and hopefully therapeutic.  Please see requesting physician notes for further details and justification.   Positive Fortin finger sign, Patrick's testing,and lateral compression test.    ROS Otherwise per HPI.  Assessment & Plan: Visit Diagnoses:    ICD-10-CM   1. Sacroiliitis (HCC)  M46.1 Sacroiliac Joint Inj, Left    XR C-ARM NO REPORT      Plan: No additional findings.   Meds & Orders: No orders of the defined types were placed in this encounter.   Orders Placed This Encounter  Procedures   Sacroiliac Joint Inj, Left   XR C-ARM NO REPORT    Follow-up: Return for visit to requesting provider as needed.   Procedures: Sacroiliac Joint Inj, Left on 11/21/2022 3:15 PM Indications: pain and diagnostic evaluation Details: 22 G 3.5 in needle, fluoroscopy-guided posterior approach Medications: 2 mL bupivacaine 0.5 %; 40 mg methylPREDNISolone acetate 40 MG/ML Outcome: tolerated well, no immediate complications  Sacroiliac Joint Intra-Articular Injection - Posterior Approach with Fluoroscopic Guidance   Position: PRONE  Additional Comments: Vital signs were monitored before and after the procedure. Patient was prepped and draped in the usual sterile fashion. The correct patient, procedure, and site was verified.   Injection Procedure Details:   Location/Site:  Sacroiliac joint  Needle size: 3.5 in Spinal Needle  Needle type:  Spinal  Needle Placement: Intra-articular  Findings:  -Comments: There was excellent flow of contrast producing a partial arthrogram of the sacroiliac joint.   Procedure Details: Starting with a 90 degree vertical and midline orientation the fluoroscope was tilted cranially 20 to 25 degrees and the target area of the inferior most part of the SI joint on the side mentioned above was visualized.  The soft tissues overlying this target were infiltrated with 4 ml. of 1% Lidocaine without Epinephrine. A #22 gauge spinal needle was inserted perpendicular to the fluoroscope table and advanced into the posterior inferior joint space using fluoroscopic guidance.  Position in the joint space was confirmed by obtaining a partial arthrogram using a 2 ml. volume of Isovue-250 contrast agent. After negative aspirate for gross pus or blood, the injectate was delivered to the joint. Radiographs were obtained for documentation purposes.   Additional Comments:   Dressing: Bandaid    Post-procedure details: Patient was observed during the procedure. Post-procedure instructions were reviewed.  Patient left the clinic in stable condition.    There was excellent flow of contrast producing a partial arthrogram of the sacroiliac joint.  Procedure, treatment alternatives, risks and benefits explained, specific risks discussed. Consent was given by the patient. Immediately prior to procedure a time out was called to verify the correct patient, procedure, equipment, support staff and site/side marked as required. Patient was prepped and draped in the usual sterile fashion.          Clinical History: No specialty comments available.     Objective:  VS:  HT:    WT:   BMI:  BP:   HR: bpm  TEMP: ( )  RESP:  Physical Exam   Imaging: No results found.

## 2022-11-24 MED ORDER — BUPIVACAINE HCL 0.5 % IJ SOLN
2.0000 mL | INTRAMUSCULAR | Status: AC | PRN
  Administered 2022-11-21: 2 mL via INTRA_ARTICULAR

## 2022-11-24 MED ORDER — METHYLPREDNISOLONE ACETATE 40 MG/ML IJ SUSP
40.0000 mg | INTRAMUSCULAR | Status: AC | PRN
  Administered 2022-11-21: 40 mg via INTRA_ARTICULAR

## 2022-11-26 ENCOUNTER — Encounter: Payer: Self-pay | Admitting: Physical Medicine and Rehabilitation

## 2022-12-21 ENCOUNTER — Other Ambulatory Visit: Payer: Self-pay | Admitting: Physical Medicine and Rehabilitation

## 2022-12-24 ENCOUNTER — Encounter: Payer: Self-pay | Admitting: Physical Medicine and Rehabilitation

## 2022-12-28 ENCOUNTER — Ambulatory Visit
Admission: RE | Admit: 2022-12-28 | Discharge: 2022-12-28 | Disposition: A | Discharge status: Home / Self Care | Payer: BC Managed Care – PPO | Source: Ambulatory Visit | Attending: Physical Medicine and Rehabilitation | Admitting: Physical Medicine and Rehabilitation

## 2023-01-06 ENCOUNTER — Encounter: Payer: Self-pay | Admitting: Physical Medicine and Rehabilitation

## 2023-01-06 ENCOUNTER — Ambulatory Visit: Payer: BC Managed Care – PPO | Admitting: Physical Medicine and Rehabilitation

## 2023-01-06 DIAGNOSIS — M5442 Lumbago with sciatica, left side: Secondary | ICD-10-CM | POA: Diagnosis not present

## 2023-01-06 DIAGNOSIS — G8929 Other chronic pain: Secondary | ICD-10-CM

## 2023-01-06 DIAGNOSIS — M533 Sacrococcygeal disorders, not elsewhere classified: Secondary | ICD-10-CM | POA: Diagnosis not present

## 2023-01-06 DIAGNOSIS — M542 Cervicalgia: Secondary | ICD-10-CM | POA: Diagnosis not present

## 2023-01-06 DIAGNOSIS — M7918 Myalgia, other site: Secondary | ICD-10-CM
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
## 2023-01-25 ENCOUNTER — Other Ambulatory Visit: Payer: Self-pay | Admitting: Physical Medicine and Rehabilitation

## 2023-01-27 ENCOUNTER — Ambulatory Visit: Payer: Self-pay | Admitting: Physical Therapy

## 2023-02-17 ENCOUNTER — Ambulatory Visit (INDEPENDENT_AMBULATORY_CARE_PROVIDER_SITE_OTHER): Payer: 59 | Admitting: Physical Therapy

## 2023-02-17 ENCOUNTER — Encounter: Payer: Self-pay | Admitting: Physical Therapy

## 2023-02-17 DIAGNOSIS — M5459 Other low back pain: Secondary | ICD-10-CM

## 2023-02-17 DIAGNOSIS — M25552 Pain in left hip: Secondary | ICD-10-CM

## 2023-02-17 DIAGNOSIS — M542 Cervicalgia: Secondary | ICD-10-CM | POA: Diagnosis not present

## 2023-02-17 NOTE — Therapy (Signed)
OUTPATIENT PHYSICAL THERAPY EVALUATION   Patient Name: Ashley Pham MRN: [id number] DOB:Nov 08, 1980, 43 y.o., female Today's Date: 02/17/2023  END OF SESSION:  PT End of Session - 02/17/23 1634     Visit Number 1    Number of Visits 1    Date for PT Re-Evaluation 03/17/23    Authorization Type [employer]    PT Start Time 1550    PT Stop Time 1630    PT Time Calculation (min) 40 min    Activity Tolerance Patient tolerated treatment well    Behavior During Therapy [facility] for tasks assessed/performed             Past Medical History:  Diagnosis Date   Anemia    Anxiety    Depression    Dyspnea    Elevated troponin 03/11/2015   Excessive daytime sleepiness 04/21/2015   Headache    Hypertension    LFT elevation    Morbid obesity (HCC)    Syncope and collapse 03/11/2015   Traumatic rhabdomyolysis (HCC)    Vaginal Pap smear, abnormal    Past Surgical History:  Procedure Laterality Date   CESAREAN SECTION N/A 10/11/2020   Procedure: CESAREAN SECTION;  Surgeon: Gerald Leitz, MD;  Location: [facility];  Service: Obstetrics;  Laterality: N/A;   SPLIT NIGHT STUDY  06/19/2015   sweat gland removal     WISDOM TOOTH EXTRACTION     Patient Active Problem List   Diagnosis Date Noted   DDD (degenerative disc disease), cervical 10/30/2022   Chronic midline low back pain without sciatica 10/09/2022   Loud snoring 10/09/2022   Moderate episode of recurrent major depressive disorder (HCC) 08/02/2021   Primary hypertension 08/02/2021   Menorrhagia with regular cycle 08/02/2021   Excessive daytime sleepiness 04/21/2015   Morbid obesity (HCC)    Migraine with aura and without status migrainosus, not intractable 11/08/2013    PCP: Etta Grandchild, MD]  REFERRING PROVIDER: Juanda Chance, NP   REFERRING DIAG: M54.2 (ICD-10-CM) - Cervicalgia M54.42,G89.29 (ICD-10-CM) - Chronic left-sided low back pain with left-sided sciatica M53.3 (ICD-10-CM) - Sacroiliac joint dysfunction M79.18  (ICD-10-CM) - Myofascial pain syndrome  Rationale for Evaluation and Treatment: Rehabilitation  THERAPY DIAG:  Other low back pain  Pain in left hip  Cervicalgia  ONSET DATE: chronic pain >2 years   SUBJECTIVE:                                                                                                                                                                                           SUBJECTIVE STATEMENT: She has been having pain since the birth of her  last child 2 years ago.The pain started in her buttock area and her neck hurts of occasion. She gets numbness in her hands. She had injection in SIJ but does not feel it helped. She gets some numbness in her legs if sitting on floor with her legs straight, no other times. She does express some urge incontinence at times.  PERTINENT HISTORY:  See above PMH  PAIN:  NPRS scale:/810 in back/left hip and  5/10 neck upon arrival Pain location:low back and left SIJ, neck Pain description: constant, ache Aggravating factors: bending, squatting, lifting, prolonged sitting then go to standing, steps Relieving factors: rest, meds   PRECAUTIONS:  None  RED FLAGS: None   WEIGHT BEARING RESTRICTIONS:  No  FALLS:  Has patient fallen in last 6 months? 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  DIAGNOSTIC FINDINGS:  IMPRESSION: 1. Multilevel cervical spondylosis with resultant mild spinal stenosis at C4-5 and C5-6. 2. Multifactorial degenerative changes with resultant multilevel foraminal narrowing as above. Notable findings include mild right C4 and C5 foraminal stenosis, moderate left C6 and C7 foraminal narrowing, with mild to moderate right C7 foraminal stenosis. 3. Trace syrinx at the  level of C6-7, similar as compared to previous MRI.  Lumbar XR IMPRESSION: 1. Mild L4-L5 facet hypertrophy. 2. Slight sclerosis about the left sacroiliac joint may represent sacroiliitis.  PATIENT SURVEYS:  FOTO not done as one time visit only  COGNITIVE STATUS: Within functional limits for tasks assessed   SENSATION: WFL  EDEMA:  No   GAIT: Distance walked: Dispensing optician utilized: None Level of assistance: Complete Independence Comments: WNL gait pattern   Body Part #1 Lumbar  PALPATION: Tender to palpation in lumbar spine and left superior glute/piriformis  LUMBAR and LE ROM: WNL    LOWER EXTREMITY MMT:  5/5 Grossly BIlat     SPECIAL TESTS:  Lumbar Straight leg raise test: Negative, Slump test: Positive, and SI Compression/distraction test: Positive     Body Part #2 Cervical  PALPATION: Tender to palpation in upper traps, Rt more than Lt  ROM- Neck and shoulder ROM WNL grossly   SPECIAL TESTS:  Cervical Spurling's test: Positive                                                                                                                            TREATMENT DATE:  02/17/23 HEP creation and review with education on this and verbal cues with demonstration of them, see below for details     PATIENT EDUCATION:  Education details: PT plan of care, HEP, when to follow up with MD Person educated: Patient Education method: Programmer, multimedia, Facilities manager, Verbal cues, and Handouts Education comprehension: verbalized understanding  HOME EXERCISE PROGRAM: Access Code:  [facility] URL: [email or web address] Date: 02/17/2023 Prepared by: Ivery Quale  Exercises - Standing Lumbar Extension at Wall - Forearms  - 2 x daily - 6 x weekly - 1 sets - 10 reps - 3 sec hold - Supine Bridge  - 2 x daily - 6 x weekly - 2 sets - 10 reps - 5 hold - Hooklying Isometric Clamshell  - 2 x daily - 6 x weekly - 1 sets - 10 reps - 5 sec  hold - Supine Hip Adduction Isometric with Ball  - 2 x daily - 6 x weekly - 1 sets - 10 reps - 5 sec hold - Hooklying Isometric Hip Flexion  - 2 x daily - 6 x weekly - 1 sets - 10 reps - 5 sec hold - Supine Figure 4 Piriformis Stretch (Mirrored)  - 2 x daily - 6 x weekly - 1 sets - 3 reps - 30 hold - Seated Passive Cervical Retraction  - 2 x daily - 6 x weekly - 1 sets - 10 reps - Seated Upper Trapezius Stretch  - 2 x daily - 6 x weekly - 1 sets - 2-3 reps - 30 sec hold - Seated Thoracic Lumbar Extension with Pectoralis Stretch  - 2 x daily - 6 x weekly - 1 sets - 10 reps - 5 sec hold   ASSESSMENT:  CLINICAL IMPRESSION: Patient was seen today for physical therapy evaluation and treatment for chronic back pain, chronic left SIJ pain, and chronic neck pain. She has inconclusive signs of neural impingement/radicular symptoms today. She has high co pay and due to financial restrictions she would like for this to be one time visit for HEP. I printed this out for her and reviewed it for understanding. I do recommend that she follow back up with MD within 4 weeks if no improvement from HEP.    OBJECTIVE IMPAIRMENTS: pain.   ACTIVITY LIMITATIONS: carrying, lifting, bending, sitting, standing, squatting, stairs, and caring for others  PARTICIPATION LIMITATIONS: cleaning, laundry, driving, shopping, community activity, and occupation  PERSONAL FACTORS: Time since onset of injury/illness/exacerbation and 1-2 comorbidities:    are also affecting patient's functional outcome.   REHAB POTENTIAL: Good  CLINICAL DECISION MAKING: Evolving/moderate complexity  EVALUATION COMPLEXITY: Moderate   GOALS: Goals to be written if she returns to PT, she requests one tme visit today    PLAN:  PT FREQUENCY: one time visit  PT DURATION:   PLANNED INTERVENTIONS: 97110-Therapeutic exercises, 97530- Therapeutic activity, [id number]- Neuromuscular re-education, 97535- Self Care, and [id number]- Manual therapy.  PLAN  FOR NEXT SESSION: hold PT open for 30 days to trial HEP, will DC after that she feels she does not need to return.   April Manson, PT,DPT 02/17/2023, 4:36 PM

## 2023-03-03 ENCOUNTER — Other Ambulatory Visit: Payer: Self-pay

## 2023-03-03 ENCOUNTER — Ambulatory Visit (HOSPITAL_COMMUNITY)
Admission: EM | Admit: 2023-03-03 | Discharge: 2023-03-03 | Disposition: A | Discharge status: Home / Self Care | Payer: 59 | Attending: Family Medicine | Admitting: Family Medicine

## 2023-03-03 ENCOUNTER — Encounter (HOSPITAL_COMMUNITY): Payer: Self-pay | Admitting: *Deleted

## 2023-03-03 DIAGNOSIS — L03012 Cellulitis of left finger: Secondary | ICD-10-CM

## 2023-03-03 DIAGNOSIS — L02512 Cutaneous abscess of left hand: Secondary | ICD-10-CM

## 2023-03-03 MED ORDER — LIDOCAINE HCL (PF) 1 % IJ SOLN
INTRAMUSCULAR | Status: AC
Start: 2023-03-03 — End: ?
  Filled 2023-03-03: qty 30

## 2023-03-03 MED ORDER — CEPHALEXIN 500 MG PO CAPS: 500.0000 mg | ORAL_CAPSULE | Freq: Two times a day (BID) | ORAL | 0 refills | Status: DC

## 2023-03-03 NOTE — ED Triage Notes (Signed)
 PT reports she was scratched on LT index finger and now the finger is swollen and red.

## 2023-03-03 NOTE — ED Notes (Signed)
 Reviewed work note.    Provided guaze, tape and band aids.
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
## 2023-03-03 NOTE — Discharge Instructions (Addendum)
 Please continue to cover the area, after the next 2 days you may start putting a Band-Aid over the area in the meantime you can just wrap it with gauze.  If you feel like the area is still tender or getting worse in terms of redness/pain.  Please come back and see [place].  Please continue taking your antibiotics as directed.

## 2023-04-22 ENCOUNTER — Other Ambulatory Visit: Payer: Self-pay | Admitting: Internal Medicine

## 2023-04-22 DIAGNOSIS — F331 Major depressive disorder, recurrent, moderate: Secondary | ICD-10-CM

## 2023-05-04 ENCOUNTER — Other Ambulatory Visit: Payer: Self-pay | Admitting: Internal Medicine

## 2023-05-04 DIAGNOSIS — F331 Major depressive disorder, recurrent, moderate: Secondary | ICD-10-CM

## 2023-06-11 ENCOUNTER — Other Ambulatory Visit: Payer: Self-pay | Admitting: Internal Medicine

## 2023-06-11 DIAGNOSIS — F331 Major depressive disorder, recurrent, moderate: Secondary | ICD-10-CM

## 2023-07-14 ENCOUNTER — Other Ambulatory Visit: Payer: Self-pay | Admitting: Internal Medicine

## 2023-07-14 DIAGNOSIS — F331 Major depressive disorder, recurrent, moderate: Secondary | ICD-10-CM

## 2023-07-14 NOTE — Telephone Encounter (Signed)
 Copied from CRM [id number]. Topic: Clinical - Medication Refill >> Jul 14, 2023 12:32 PM Rosina BIRCH wrote: Medication: Vilazodone  HCl (VIIBRYD ) 40 MG TABS  Has the patient contacted their pharmacy? No (Agent: If no, request that the patient contact the pharmacy for the refill. If patient does not wish to contact the pharmacy document the reason why and proceed with request.) (Agent: If yes, when and what did the pharmacy advise?)  This is the patient's preferred pharmacy:  [phone], [place] - [address] [place] [address] Phone: [phone] Fax: [phone]  Is this the correct pharmacy for this prescription? Yes If no, delete pharmacy and type the correct one.   Has the prescription been filled recently? Yes  Is the patient out of the medication? No  Has the patient been seen for an appointment in the last year OR does the patient have an upcoming appointment? Yes  Can we respond through MyChart? Yes  Agent: Please be advised that Rx refills may take up to 3 business days. We ask that you follow-up with your pharmacy.

## 2023-07-16 MED ORDER — VILAZODONE HCL 40 MG PO TABS: 40.0000 mg | ORAL_TABLET | Freq: Every day | ORAL | 0 refills | Status: DC

## 2023-08-05 ENCOUNTER — Ambulatory Visit: Admitting: Internal Medicine

## 2023-08-05 ENCOUNTER — Encounter: Payer: Self-pay | Admitting: Internal Medicine

## 2023-08-05 ENCOUNTER — Telehealth: Payer: Self-pay

## 2023-08-05 ENCOUNTER — Other Ambulatory Visit (HOSPITAL_COMMUNITY): Payer: Self-pay

## 2023-08-05 VITALS — BP 132/76 | HR 100 | Temp 98.2°F | Resp 16 | Ht 63.0 in | Wt 264.8 lb

## 2023-08-05 DIAGNOSIS — N926 Irregular menstruation, unspecified: Secondary | ICD-10-CM | POA: Insufficient documentation

## 2023-08-05 DIAGNOSIS — R011 Cardiac murmur, unspecified: Secondary | ICD-10-CM

## 2023-08-05 DIAGNOSIS — L905 Scar conditions and fibrosis of skin: Secondary | ICD-10-CM

## 2023-08-05 DIAGNOSIS — O10019 Pre-existing essential hypertension complicating pregnancy, unspecified trimester: Secondary | ICD-10-CM | POA: Insufficient documentation

## 2023-08-05 DIAGNOSIS — I10 Essential (primary) hypertension: Secondary | ICD-10-CM | POA: Diagnosis not present

## 2023-08-05 DIAGNOSIS — Z23 Encounter for immunization: Secondary | ICD-10-CM | POA: Diagnosis not present

## 2023-08-05 DIAGNOSIS — N970 Female infertility associated with anovulation: Secondary | ICD-10-CM | POA: Insufficient documentation

## 2023-08-05 DIAGNOSIS — Z6841 Body Mass Index (BMI) 40.0 and over, adult: Secondary | ICD-10-CM | POA: Insufficient documentation

## 2023-08-05 DIAGNOSIS — Z0000 Encounter for general adult medical examination without abnormal findings: Secondary | ICD-10-CM

## 2023-08-05 DIAGNOSIS — K219 Gastro-esophageal reflux disease without esophagitis: Secondary | ICD-10-CM | POA: Insufficient documentation

## 2023-08-05 DIAGNOSIS — R0683 Snoring: Secondary | ICD-10-CM | POA: Diagnosis not present

## 2023-08-05 DIAGNOSIS — F331 Major depressive disorder, recurrent, moderate: Secondary | ICD-10-CM

## 2023-08-05 DIAGNOSIS — Z0001 Encounter for general adult medical examination with abnormal findings: Secondary | ICD-10-CM | POA: Insufficient documentation

## 2023-08-05 DIAGNOSIS — F325 Major depressive disorder, single episode, in full remission: Secondary | ICD-10-CM | POA: Insufficient documentation

## 2023-08-05 DIAGNOSIS — G4719 Other hypersomnia: Secondary | ICD-10-CM

## 2023-08-05 DIAGNOSIS — Z1231 Encounter for screening mammogram for malignant neoplasm of breast: Secondary | ICD-10-CM | POA: Insufficient documentation

## 2023-08-05 LAB — CBC WITH DIFFERENTIAL/PLATELET
Basophils Absolute: 0 K/uL (ref 0.0–0.1)
Basophils Relative: 0.3 % (ref 0.0–3.0)
Eosinophils Absolute: 0 K/uL (ref 0.0–0.7)
Eosinophils Relative: 0.5 % (ref 0.0–5.0)
HCT: 37.7 % (ref 36.0–46.0)
Hemoglobin: 12.3 g/dL (ref 12.0–15.0)
Lymphocytes Relative: 20.6 % (ref 12.0–46.0)
Lymphs Abs: 1.7 K/uL (ref 0.7–4.0)
MCHC: 32.6 g/dL (ref 30.0–36.0)
MCV: 80 fl (ref 78.0–100.0)
Monocytes Absolute: 0.4 K/uL (ref 0.1–1.0)
Monocytes Relative: 4.5 % (ref 3.0–12.0)
Neutro Abs: 6.2 K/uL (ref 1.4–7.7)
Neutrophils Relative %: 74.1 % (ref 43.0–77.0)
Platelets: 241 K/uL (ref 150.0–400.0)
RBC: 4.71 Mil/uL (ref 3.87–5.11)
RDW: 14.8 % (ref 11.5–15.5)
WBC: 8.4 K/uL (ref 4.0–10.5)

## 2023-08-05 LAB — BASIC METABOLIC PANEL WITH GFR
BUN: 7 mg/dL (ref 6–23)
CO2: 31 meq/L (ref 19–32)
Calcium: 8.5 mg/dL (ref 8.4–10.5)
Chloride: 104 meq/L (ref 96–112)
Creatinine, Ser: 0.87 mg/dL (ref 0.40–1.20)
GFR: 83.1 mL/min (ref >60.00)
Glucose, Bld: 101 mg/dL — ABNORMAL HIGH (ref 70–99)
Potassium: 3.6 meq/L (ref 3.5–5.1)
Sodium: 139 meq/L (ref 135–145)

## 2023-08-05 LAB — HEMOGLOBIN A1C: Hgb A1c MFr Bld: 5.8 % (ref 4.6–6.5)

## 2023-08-05 LAB — TSH: TSH: 0.63 u[IU]/mL (ref 0.35–5.50)

## 2023-08-05 MED ORDER — ZEPBOUND 2.5 MG/0.5ML ~~LOC~~ SOAJ: 2.5000 mg | SUBCUTANEOUS | 0 refills | Status: AC

## 2023-08-05 MED ORDER — VILAZODONE HCL 40 MG PO TABS: 40.0000 mg | ORAL_TABLET | Freq: Every day | ORAL | 1 refills | Status: DC

## 2023-08-05 MED ORDER — MODAFINIL 100 MG PO TABS: 100.0000 mg | ORAL_TABLET | Freq: Every day | ORAL | 0 refills | Status: AC
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
## 2023-08-05 NOTE — Telephone Encounter (Signed)
 Pharmacy Patient Advocate Encounter   Received notification from CoverMyMeds that prior authorization for Modafinil  100MG  tablets  is required/requested.   Insurance verification completed.   The patient is insured through [facility] .   Per test claim: PA required; PA started via CoverMyMeds. KEY [id number] . Waiting for clinical questions to populate.

## 2023-08-05 NOTE — Patient Instructions (Addendum)
 You received your first Hepatitis B vaccine today. Please return for the second dose in one month.   Health Maintenance, Female Adopting a healthy lifestyle and getting preventive care are important in promoting health and wellness. Ask your health care provider about: The right schedule for you to have regular tests and exams. Things you can do on your own to prevent diseases and keep yourself healthy. What should I know about diet, weight, and exercise? Eat a healthy diet  Eat a diet that includes plenty of vegetables, fruits, low-fat dairy products, and lean protein. Do not eat a lot of foods that are high in solid fats, added sugars, or sodium. Maintain a healthy weight Body mass index (BMI) is used to identify weight problems. It estimates body fat based on height and weight. Your health care provider can help determine your BMI and help you achieve or maintain a healthy weight. Get regular exercise Get regular exercise. This is one of the most important things you can do for your health. Most adults should: Exercise for at least 150 minutes each week. The exercise should increase your heart rate and make you sweat (moderate-intensity exercise). Do strengthening exercises at least twice a week. This is in addition to the moderate-intensity exercise. Spend less time sitting. Even light physical activity can be beneficial. Watch cholesterol and blood lipids Have your blood tested for lipids and cholesterol at 41 years of age, then have this test every 5 years. Have your cholesterol levels checked more often if: Your lipid or cholesterol levels are high. You are older than 41 years of age. You are at high risk for heart disease. What should I know about cancer screening? Depending on your health history and family history, you may need to have cancer screening at various ages. This may include screening for: Breast cancer. Cervical cancer. Colorectal cancer. Skin cancer. Lung  cancer. What should I know about heart disease, diabetes, and high blood pressure? Blood pressure and heart disease High blood pressure causes heart disease and increases the risk of stroke. This is more likely to develop in people who have high blood pressure readings or are overweight. Have your blood pressure checked: Every 3-5 years if you are 88-54 years of age. Every year if you are 38 years old or older. Diabetes Have regular diabetes screenings. This checks your fasting blood sugar level. Have the screening done: Once every three years after age 23 if you are at a normal weight and have a low risk for diabetes. More often and at a younger age if you are overweight or have a high risk for diabetes. What should I know about preventing infection? Hepatitis B If you have a higher risk for hepatitis B, you should be screened for this virus. Talk with your health care provider to find out if you are at risk for hepatitis B infection. Hepatitis C Testing is recommended for: Everyone born from [birth year] through 1963. Anyone with known risk factors for hepatitis C. Sexually transmitted infections (STIs) Get screened for STIs, including gonorrhea and chlamydia, if: You are sexually active and are younger than 42 years of age. You are older than 41 years of age and your health care provider tells you that you are at risk for this type of infection. Your sexual activity has changed since you were last screened, and you are at increased risk for chlamydia or gonorrhea. Ask your health care provider if you are at risk. Ask your health care provider about whether you  are at high risk for HIV. Your health care provider may recommend a prescription medicine to help prevent HIV infection. If you choose to take medicine to prevent HIV, you should first get tested for HIV. You should then be tested every 3 months for as long as you are taking the medicine. Pregnancy If you are about to stop having your  period (premenopausal) and you may become pregnant, seek counseling before you get pregnant. Take 400 to 800 micrograms (mcg) of folic acid every day if you become pregnant. Ask for birth control (contraception) if you want to prevent pregnancy. Osteoporosis and menopause Osteoporosis is a disease in which the bones lose minerals and strength with aging. This can result in bone fractures. If you are 68 years old or older, or if you are at risk for osteoporosis and fractures, ask your health care provider if you should: Be screened for bone loss. Take a calcium or vitamin D supplement to lower your risk of fractures. Be given hormone replacement therapy (HRT) to treat symptoms of menopause. Follow these instructions at home: Alcohol use Do not drink alcohol if: Your health care provider tells you not to drink. You are pregnant, may be pregnant, or are planning to become pregnant. If you drink alcohol: Limit how much you have to: 0-1 drink a day. Know how much alcohol is in your drink. In the [place] one drink equals one 12 oz bottle of beer (355 mL), one 5 oz glass of wine (148 mL), or one 1 oz glass of hard liquor (44 mL). Lifestyle Do not use any products that contain nicotine or tobacco. These products include cigarettes, chewing tobacco, and vaping devices, such as e-cigarettes. If you need help quitting, ask your health care provider. Do not use street drugs. Do not share needles. Ask your health care provider for help if you need support or information about quitting drugs. General instructions Schedule regular health, dental, and eye exams. Stay current with your vaccines. Tell your health care provider if: You often feel depressed. You have ever been abused or do not feel safe at home. Summary Adopting a healthy lifestyle and getting preventive care are important in promoting health and wellness. Follow your health care provider's instructions about healthy diet, exercising, and  getting tested or screened for diseases. Follow your health care provider's instructions on monitoring your cholesterol and blood pressure. This information is not intended to replace advice given to you by your health care provider. Make sure you discuss any questions you have with your health care provider. Document Revised: 05/22/2020 Document Reviewed: 05/22/2020 Elsevier Patient Education  2024 [employer].

## 2023-08-06 ENCOUNTER — Ambulatory Visit: Admitting: Nurse Practitioner

## 2023-08-06 ENCOUNTER — Encounter: Payer: Self-pay | Admitting: Nurse Practitioner

## 2023-08-06 VITALS — BP 120/84 | HR 83 | Ht 63.0 in | Wt 265.2 lb

## 2023-08-06 DIAGNOSIS — Z6841 Body Mass Index (BMI) 40.0 and over, adult: Secondary | ICD-10-CM | POA: Diagnosis not present

## 2023-08-06 DIAGNOSIS — G4719 Other hypersomnia: Secondary | ICD-10-CM

## 2023-08-06 DIAGNOSIS — R0683 Snoring: Secondary | ICD-10-CM | POA: Diagnosis not present

## 2023-08-06 NOTE — Patient Instructions (Signed)
 Given your symptoms, I am concerned that you may have sleep disordered breathing with sleep apnea. You will need a sleep study for further evaluation. Someone will contact you to schedule this.   We discussed how untreated sleep apnea puts an individual at risk for cardiac arrhthymias, pulm HTN, DM, stroke and increases their risk for daytime accidents. We also briefly reviewed treatment options including weight loss, side sleeping position, oral appliance, CPAP therapy or referral to ENT for possible surgical options  Use caution when driving and pull over if you become sleepy.  Follow up in 6 weeks with Ashley Emmanuell Kantz,NP to go over sleep study results, or sooner, if needed. Friday PM virtual clinic preferred

## 2023-08-06 NOTE — Assessment & Plan Note (Addendum)
 BMI 46. If she does have moderate to severe OSA, would be a candidate for GLP-1 management with Zepbound . Will review at follow up.

## 2023-08-06 NOTE — Telephone Encounter (Signed)
 PLEASE BE ADVISED Clinical questions have been answered and PA submitted.TO PLAN. PA currently Pending.
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
## 2023-08-06 NOTE — Assessment & Plan Note (Addendum)
 She has snoring, excessive daytime sleepiness, nocturnal apneic events, morning headaches, drowsy driving, non-restorative sleep. BMI 46. History of HTN, migraines. Given this,  I am concerned she could have sleep disordered breathing with obstructive sleep apnea. She will need sleep study for further evaluation. If negative for OSA, recommend in lab MSLT to evaluate for narcolepsy.    - discussed how weight can impact sleep and risk for sleep disordered breathing - discussed options to assist with weight loss: combination of diet modification, cardiovascular and strength training exercises   - had an extensive discussion regarding the adverse health consequences related to untreated sleep disordered breathing - specifically discussed the risks for hypertension, coronary artery disease, cardiac dysrhythmias, cerebrovascular disease, and diabetes - lifestyle modification discussed   - discussed how sleep disruption can increase risk of accidents, particularly when driving - safe driving practices were discussed  Patient Instructions  Given your symptoms, I am concerned that you may have sleep disordered breathing with sleep apnea. You will need a sleep study for further evaluation. Someone will contact you to schedule this.   We discussed how untreated sleep apnea puts an individual at risk for cardiac arrhthymias, pulm HTN, DM, stroke and increases their risk for daytime accidents. We also briefly reviewed treatment options including weight loss, side sleeping position, oral appliance, CPAP therapy or referral to ENT for possible surgical options  Use caution when driving and pull over if you become sleepy.  Follow up in 6 weeks with Katie Mardy Lucier,NP to go over sleep study results, or sooner, if needed. Friday PM virtual clinic preferred

## 2023-08-06 NOTE — Telephone Encounter (Signed)
 Pharmacy Patient Advocate Encounter  Received notification from [facility] that Prior Authorization for Modafinil  100MG  tablets  has been DENIED.  Full denial letter will be uploaded to the media tab. See denial reason below.    PA #/Case ID/Reference #: [id number]

## 2023-08-06 NOTE — Assessment & Plan Note (Signed)
 See above

## 2023-08-07 NOTE — Telephone Encounter (Signed)
 Patient has been made aware and gave a verbal understanding.

## 2023-08-07 NOTE — Telephone Encounter (Signed)
 Copied from CRM [id number]. Topic: Clinical - Medication Question >> Aug 07, 2023 10:49 AM Viola F wrote: Reason for CRM: Patient requested call back from [facility] - she spoke to insurance company regarding the Modafinil  medication and would like to give her an update. Please call her at [phone] (M)

## 2023-08-07 NOTE — Telephone Encounter (Signed)
 Patient has spoken with the insurance company. And she wants to wait to se if she has sleep apnea before we try yo run the PA again.

## 2023-08-08 ENCOUNTER — Ambulatory Visit: Payer: Self-pay | Admitting: Internal Medicine

## 2023-08-08 NOTE — Telephone Encounter (Signed)
 We discuss this when we talked on the phone about her other medication.

## 2023-08-21 ENCOUNTER — Institutional Professional Consult (permissible substitution)

## 2023-08-27 ENCOUNTER — Ambulatory Visit
Admission: RE | Admit: 2023-08-27 | Discharge: 2023-08-27 | Disposition: A | Discharge status: Home / Self Care | Source: Ambulatory Visit | Attending: Internal Medicine | Admitting: Internal Medicine

## 2023-08-27 DIAGNOSIS — Z1231 Encounter for screening mammogram for malignant neoplasm of breast: Secondary | ICD-10-CM

## 2023-09-11 ENCOUNTER — Ambulatory Visit (HOSPITAL_COMMUNITY)
Admission: RE | Admit: 2023-09-11 | Discharge: 2023-09-11 | Disposition: A | Discharge status: Home / Self Care | Source: Ambulatory Visit | Attending: Cardiology | Admitting: Cardiology

## 2023-09-11 DIAGNOSIS — R9431 Abnormal electrocardiogram [ECG] [EKG]: Secondary | ICD-10-CM

## 2023-09-11 DIAGNOSIS — R011 Cardiac murmur, unspecified: Secondary | ICD-10-CM | POA: Diagnosis not present

## 2023-09-11 LAB — ECHOCARDIOGRAM COMPLETE
Area-P 1/2: 7.16 cm2
S' Lateral: 2.32 cm

## 2023-09-16 ENCOUNTER — Institutional Professional Consult (permissible substitution)

## 2023-09-17 ENCOUNTER — Encounter: Payer: Self-pay | Admitting: Internal Medicine

## 2023-09-17 ENCOUNTER — Ambulatory Visit (INDEPENDENT_AMBULATORY_CARE_PROVIDER_SITE_OTHER)

## 2023-09-17 DIAGNOSIS — E538 Deficiency of other specified B group vitamins: Secondary | ICD-10-CM

## 2023-09-17 NOTE — Progress Notes (Signed)
After obtaining consent, and per orders of Dr. Yetta Barre, injection of Hep B given by Ferdie Ping. Patient instructed to report any adverse reaction to me immediately.

## 2023-09-29 ENCOUNTER — Ambulatory Visit: Admitting: Nurse Practitioner

## 2023-09-29 ENCOUNTER — Encounter: Payer: Self-pay | Admitting: Nurse Practitioner

## 2023-09-29 VITALS — BP 114/78 | HR 91 | Ht 63.0 in | Wt 268.8 lb

## 2023-09-29 DIAGNOSIS — R0683 Snoring: Secondary | ICD-10-CM

## 2023-09-29 DIAGNOSIS — G4733 Obstructive sleep apnea (adult) (pediatric): Secondary | ICD-10-CM

## 2023-09-29 DIAGNOSIS — G4719 Other hypersomnia: Secondary | ICD-10-CM | POA: Diagnosis not present

## 2023-09-29 DIAGNOSIS — I10 Essential (primary) hypertension: Secondary | ICD-10-CM

## 2023-09-29 NOTE — Patient Instructions (Addendum)
 Start CPAP 5-15 cmH2O, mask of choice, every night, minimum of 4-6 hours a night.  Change equipment as directed. Wash your tubing with warm soap and water  daily, hang to dry. Wash humidifier portion weekly. Use bottled, distilled water  and change daily Be aware of reduced alertness and do not drive or operate heavy machinery if experiencing this or drowsiness.  Exercise encouraged, as tolerated. Healthy weight management discussed.  Avoid or decrease alcohol consumption and medications that make you more sleepy, if possible. Notify if persistent daytime sleepiness occurs even with consistent use of PAP therapy.  Change CPAP supplies... Every month Mask cushions and/or nasal pillows CPAP machine filters Every 3 months Mask frame (not including the headgear) CPAP tubing Every 6 months Mask headgear Chin strap (if applicable) Humidifier water  tub  Check in next month and we can see about Zepbound  for weight management in setting of mild sleep apnea    Follow up in 10-12 weeks with Ashley Macaria Bias,NP. If symptoms do not improve or worsen, please contact office for sooner follow up
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
## 2023-09-30 ENCOUNTER — Encounter: Payer: Self-pay | Admitting: Nurse Practitioner

## 2023-09-30 DIAGNOSIS — G4733 Obstructive sleep apnea (adult) (pediatric): Secondary | ICD-10-CM | POA: Insufficient documentation

## 2023-09-30 NOTE — Assessment & Plan Note (Addendum)
 BMI 47. Healthy weight loss encouraged. Would be appropriate for GLP-1 therapy for weight management. OSA not necessarily an indication due to mild severity. Can submit Zepbound  in October for Medicaid coverage

## 2023-09-30 NOTE — Assessment & Plan Note (Signed)
 Very mild OSA with significant daytime burden. Suspect multifactorial. Minimal cardiovascular risks associated with mild OSA. Concern for affordability of oral appliance. Encouraged healthy weight loss measures. Given severity of symptoms, pt opted for CPAP therapy. Reviewed proper care/use of device. Risks/benefits reviewed. Safe driving practices reviewed.   Patient Instructions  Start CPAP 5-15 cmH2O, mask of choice, every night, minimum of 4-6 hours a night.  Change equipment as directed. Wash your tubing with warm soap and water  daily, hang to dry. Wash humidifier portion weekly. Use bottled, distilled water  and change daily Be aware of reduced alertness and do not drive or operate heavy machinery if experiencing this or drowsiness.  Exercise encouraged, as tolerated. Healthy weight management discussed.  Avoid or decrease alcohol consumption and medications that make you more sleepy, if possible. Notify if persistent daytime sleepiness occurs even with consistent use of PAP therapy.  Change CPAP supplies... Every month Mask cushions and/or nasal pillows CPAP machine filters Every 3 months Mask frame (not including the headgear) CPAP tubing Every 6 months Mask headgear Chin strap (if applicable) Humidifier water  tub  Check in next month and we can see about Zepbound  for weight management in setting of mild sleep apnea    Follow up in 10-12 weeks with Ashley Thiago Ragsdale,NP. If symptoms do not improve or worsen, please contact office for sooner follow up

## 2023-10-14 ENCOUNTER — Ambulatory Visit

## 2023-10-14 VITALS — BP 145/98 | HR 92 | Ht 63.0 in | Wt 267.1 lb

## 2023-10-14 DIAGNOSIS — T2101XS Burn of unspecified degree of chest wall, sequela: Secondary | ICD-10-CM | POA: Diagnosis not present

## 2023-10-14 DIAGNOSIS — T2101XA Burn of unspecified degree of chest wall, initial encounter: Secondary | ICD-10-CM

## 2023-10-14 DIAGNOSIS — L905 Scar conditions and fibrosis of skin: Secondary | ICD-10-CM

## 2023-10-14 DIAGNOSIS — X19XXXS Contact with other heat and hot substances, sequela: Secondary | ICD-10-CM
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
## 2023-10-16 ENCOUNTER — Other Ambulatory Visit: Payer: Self-pay | Admitting: Internal Medicine

## 2023-10-16 ENCOUNTER — Telehealth: Payer: Self-pay

## 2023-10-16 DIAGNOSIS — F331 Major depressive disorder, recurrent, moderate: Secondary | ICD-10-CM

## 2023-10-16 NOTE — Telephone Encounter (Signed)
 Received CMN from [facility] received fax confirmation

## 2023-10-20 ENCOUNTER — Telehealth: Payer: Self-pay

## 2023-10-20 ENCOUNTER — Other Ambulatory Visit: Payer: Self-pay | Admitting: Internal Medicine

## 2023-10-20 DIAGNOSIS — F331 Major depressive disorder, recurrent, moderate: Secondary | ICD-10-CM

## 2023-10-20 NOTE — Telephone Encounter (Signed)
 Copied from CRM [id number]. Topic: Clinical - Medication Question >> Oct 20, 2023  4:01 PM Harlene ORN wrote: Reason for CRM: Patient called to follow up on Vilazodone  HCl (VIIBRYD ) 40 MG TABS. Please call back the patient when medication has been sent to the [facility] on [address].

## 2023-10-20 NOTE — Telephone Encounter (Unsigned)
 Copied from CRM #[id number]. Topic: Clinical - Medication Refill >> Oct 20, 2023 11:30 AM Precious C wrote: Medication: Vilazodone  HCl (VIIBRYD ) 40 MG TABS  Has the patient contacted their pharmacy? Yes (Agent: If no, request that the patient contact the pharmacy for the refill. If patient does not wish to contact the pharmacy document the reason why and proceed with request.) (Agent: If yes, when and what did the pharmacy advise?)  This is the patient's preferred pharmacy:  [phone] [address] [place] - [address] [place] [address] Phone: [phone] Fax: [phone]  Is this the correct pharmacy for this prescription? Yes If no, delete pharmacy and type the correct one.   Has the prescription been filled recently? No  Is the patient out of the medication? Yes  Has the patient been seen for an appointment in the last year OR does the patient have an upcoming appointment? Yes  Can we respond through MyChart? Yes  Agent: Please be advised that Rx refills may take up to 3 business days. We ask that you follow-up with your pharmacy.

## 2023-10-20 NOTE — Telephone Encounter (Signed)
 Unable to reach patient. LMTRC

## 2023-10-21 ENCOUNTER — Telehealth: Payer: Self-pay

## 2023-10-21 NOTE — Telephone Encounter (Signed)
 Unable to reach patient. LMTRC

## 2023-10-21 NOTE — Telephone Encounter (Signed)
 Patient medication has been corrected and she gave a verbal understanding.

## 2023-10-21 NOTE — Telephone Encounter (Signed)
 Duplicate call. Handling this in a newer telephone call.

## 2023-10-21 NOTE — Telephone Encounter (Signed)
 Copied from CRM #[id number]. Topic: General - Other >> Oct 20, 2023  4:31 PM Sophia H wrote: Reason for CRM: Returning phone call to [name] - Called [employer] but no answer, please reach out to patient # [phone]

## 2023-11-11 ENCOUNTER — Telehealth: Payer: Self-pay

## 2023-11-11 ENCOUNTER — Encounter: Payer: Self-pay | Admitting: Surgical

## 2023-11-11 ENCOUNTER — Ambulatory Visit (INDEPENDENT_AMBULATORY_CARE_PROVIDER_SITE_OTHER): Admitting: Surgical

## 2023-11-11 VITALS — BP 154/93 | HR 94 | Wt 269.8 lb

## 2023-11-11 DIAGNOSIS — Z7722 Contact with and (suspected) exposure to environmental tobacco smoke (acute) (chronic): Secondary | ICD-10-CM | POA: Diagnosis not present

## 2023-11-11 DIAGNOSIS — L905 Scar conditions and fibrosis of skin: Secondary | ICD-10-CM | POA: Diagnosis not present

## 2023-11-11 DIAGNOSIS — T2101XA Burn of unspecified degree of chest wall, initial encounter: Secondary | ICD-10-CM

## 2023-11-11 DIAGNOSIS — T2101XS Burn of unspecified degree of chest wall, sequela: Secondary | ICD-10-CM | POA: Diagnosis not present

## 2023-11-11 DIAGNOSIS — Z01818 Encounter for other preprocedural examination: Secondary | ICD-10-CM

## 2023-11-11 NOTE — Telephone Encounter (Signed)
 Faxed surgical clearance to PCP Dr. Debby Molt [phone]). Confirmation of receipt.
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
## 2023-11-16 ENCOUNTER — Other Ambulatory Visit: Payer: Self-pay | Admitting: Internal Medicine

## 2023-11-17 ENCOUNTER — Encounter: Payer: Self-pay | Admitting: Radiology

## 2023-11-24 ENCOUNTER — Other Ambulatory Visit (HOSPITAL_COMMUNITY): Payer: Self-pay

## 2023-11-24 ENCOUNTER — Telehealth: Payer: Self-pay

## 2023-11-24 NOTE — Telephone Encounter (Signed)
 Pharmacy Patient Advocate Encounter   Received notification from CoverMyMeds that prior authorization for Zepbound  2.5mg /0.86ml is required/requested.   Insurance verification completed.   The patient is insured through [facility].   Per test claim: Refill too soon. PA is not needed at this time. Medication was filled 11/19/23. Next eligible fill date is 12/10/23.   Prior authorization not submitted due to it was ran through the insurance and received a paid claim on 11/19/23.   Key: [id number]

## 2023-11-26 LAB — NICOTINE/COTININE METABOLITES
Cotinine: 5.6 ng/mL
Nicotine: <1 ng/mL

## 2023-12-02 DIAGNOSIS — T2101XS Burn of unspecified degree of chest wall, sequela: Secondary | ICD-10-CM | POA: Diagnosis not present

## 2023-12-02 DIAGNOSIS — L905 Scar conditions and fibrosis of skin: Secondary | ICD-10-CM | POA: Diagnosis not present

## 2023-12-03 ENCOUNTER — Other Ambulatory Visit: Payer: Self-pay | Admitting: Student

## 2023-12-03 MED ORDER — ONDANSETRON 4 MG PO TBDP: 4.0000 mg | ORAL_TABLET | Freq: Three times a day (TID) | ORAL | 0 refills | Status: AC | PRN

## 2023-12-03 MED ORDER — ACETAMINOPHEN 500 MG PO TABS: 500.0000 mg | ORAL_TABLET | Freq: Four times a day (QID) | ORAL | 0 refills | Status: AC | PRN

## 2023-12-03 MED ORDER — GABAPENTIN 300 MG PO CAPS: 300.0000 mg | ORAL_CAPSULE | Freq: Two times a day (BID) | ORAL | 0 refills | Status: AC

## 2023-12-03 MED ORDER — IBUPROFEN 600 MG PO TABS: 600.0000 mg | ORAL_TABLET | Freq: Three times a day (TID) | ORAL | 0 refills | Status: DC | PRN

## 2023-12-03 MED ORDER — TRAMADOL HCL 50 MG PO TABS: 50.0000 mg | ORAL_TABLET | Freq: Three times a day (TID) | ORAL | 0 refills | Status: AC | PRN

## 2023-12-03 NOTE — Addendum Note (Signed)
 Addended by: EUSTACIO POUR on: 12/03/2023 02:15 PM   Modules accepted: Orders

## 2023-12-03 NOTE — Progress Notes (Signed)
 Postop pain medications, patient states that she has not been nauseous or vomiting so we will hold off on sending the Zofran 

## 2023-12-12 ENCOUNTER — Ambulatory Visit (INDEPENDENT_AMBULATORY_CARE_PROVIDER_SITE_OTHER)

## 2023-12-12 VITALS — BP 122/85 | HR 105

## 2023-12-12 DIAGNOSIS — Z09 Encounter for follow-up examination after completed treatment for conditions other than malignant neoplasm: Secondary | ICD-10-CM

## 2023-12-12 NOTE — Progress Notes (Signed)
   Established Patient Office Visit  Subjective   Patient ID: KEYLEE SHRESTHA, female    DOB: [birth year]  Age: 43 y.o. MRN: [id number]  Chief Complaint  Patient presents with   Post-op Follow-up    HPI S/p bilateral breast scar revisions. Doing well after surgery. No pain. Pathology showed scars, negative for malignancy on bilateral breasts.     Objective:     BP 122/85 (BP Location: Left Arm, Patient Position: Sitting, Cuff Size: Large)   Pulse (!) 105 Comment: Pt had a high sodium breakfast.  SpO2 100%     Physical Exam Incisions C/D/I, with steri strips in place.   Last CBC Lab Results  Component Value Date   WBC 8.4 08/05/2023   HGB 12.3 08/05/2023   HCT 37.7 08/05/2023   MCV 80.0 08/05/2023   MCH 25.5 (L) 10/12/2020   RDW 14.8 08/05/2023   PLT 241.0 08/05/2023   Last metabolic panel Lab Results  Component Value Date   GLUCOSE 101 (H) 08/05/2023   NA 139 08/05/2023   K 3.6 08/05/2023   CL 104 08/05/2023   CO2 31 08/05/2023   BUN 7 08/05/2023   CREATININE 0.87 08/05/2023   GFR 83.10 08/05/2023   CALCIUM 8.5 08/05/2023   PHOS 2.6 03/11/2015   PROT 7.4 10/09/2022   ALBUMIN 4.0 10/09/2022   BILITOT 0.3 10/09/2022   ALKPHOS 48 10/09/2022   AST 14 10/09/2022   ALT 7 10/09/2022   ANIONGAP 8 10/11/2020      The ASCVD Risk score (Arnett DK, et al., 2019) failed to calculate for the following reasons:   Cannot find a previous HDL lab   Cannot find a previous total cholesterol lab    Assessment & Plan:   Problem List Items Addressed This Visit   None Visit Diagnoses       Follow-up exam    -  Primary      Pathology discussed with patient. Patient can shower. No heavy lifting for a total of 6 weeks.  Follow up in 3 months.   Dorell Gatlin M Geraldina Parrott, MD

## 2023-12-25 ENCOUNTER — Encounter: Payer: Self-pay | Admitting: Nurse Practitioner

## 2023-12-25 ENCOUNTER — Ambulatory Visit (INDEPENDENT_AMBULATORY_CARE_PROVIDER_SITE_OTHER): Admitting: Nurse Practitioner

## 2023-12-25 VITALS — BP 126/74 | HR 101 | Temp 97.6°F | Ht 63.0 in | Wt 270.4 lb

## 2023-12-25 DIAGNOSIS — G4733 Obstructive sleep apnea (adult) (pediatric): Secondary | ICD-10-CM

## 2023-12-25 DIAGNOSIS — Z6841 Body Mass Index (BMI) 40.0 and over, adult: Secondary | ICD-10-CM

## 2023-12-25 NOTE — Assessment & Plan Note (Signed)
 BMI 47. Healthy weight loss encouraged. Not a candidate for GLP 1 therapy based on OSA given mild severity. Advised to f/u with PCP to discuss weight loss measures.
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
## 2023-12-25 NOTE — Assessment & Plan Note (Addendum)
 Very mild OSA with significant daytime burden. On CPAP therapy. Utilizing the majority of nights but not always reaching her 4 hour minimum goal for use. Partially related to skin irritation; advised to trial liners/strap covers to combat this. Encouraged increase usage. Receives benefit from use. Aware of proper care/use of device. Safe driving practices reviewed.   Patient Instructions  Continue to use CPAP every night, minimum of 4-6 hours a night.  Change equipment as directed. Wash your tubing with warm soap and water  daily, hang to dry. Wash humidifier portion weekly. Use bottled, distilled water  and change daily Be aware of reduced alertness and do not drive or operate heavy machinery if experiencing this or drowsiness.  Exercise encouraged, as tolerated. Healthy weight management discussed.  Avoid or decrease alcohol consumption and medications that make you more sleepy, if possible. Notify if persistent daytime sleepiness occurs even with consistent use of PAP therapy.  Change CPAP supplies... Every month Mask cushions and/or nasal pillows CPAP machine filters Every 3 months Mask frame (not including the headgear) CPAP tubing Every 6 months Mask headgear Chin strap (if applicable) Humidifier water  tub   Try a mask liner and strap cover - you can find these online   Follow up in 6 months with Madelin Stank, NP or Landry Hope PIETY, or sooner, if needed

## 2023-12-25 NOTE — Patient Instructions (Addendum)
 Continue to use CPAP every night, minimum of 4-6 hours a night.  Change equipment as directed. Wash your tubing with warm soap and water  daily, hang to dry. Wash humidifier portion weekly. Use bottled, distilled water  and change daily Be aware of reduced alertness and do not drive or operate heavy machinery if experiencing this or drowsiness.  Exercise encouraged, as tolerated. Healthy weight management discussed.  Avoid or decrease alcohol consumption and medications that make you more sleepy, if possible. Notify if persistent daytime sleepiness occurs even with consistent use of PAP therapy.  Change CPAP supplies... Every month Mask cushions and/or nasal pillows CPAP machine filters Every 3 months Mask frame (not including the headgear) CPAP tubing Every 6 months Mask headgear Chin strap (if applicable) Humidifier water  tub   Try a mask liner and strap cover - you can find these online   Follow up in 6 months with Ashley Stank, NP or Ashley Pham, or sooner, if needed

## 2023-12-26 ENCOUNTER — Encounter: Admitting: Student

## 2024-01-20 ENCOUNTER — Other Ambulatory Visit: Payer: Self-pay | Admitting: Internal Medicine

## 2024-01-20 DIAGNOSIS — F331 Major depressive disorder, recurrent, moderate: Secondary | ICD-10-CM

## 2024-01-20 NOTE — Telephone Encounter (Unsigned)
 Copied from CRM #[id number]. Topic: Clinical - Medication Refill >> Jan 20, 2024  3:33 PM Jayma L wrote: Medication:Vilazodone  HCl (VIIBRYD ) 40 MG TABS  Asking for one month   Has the patient contacted their pharmacy? Yes (Agent: If no, request that the patient contact the pharmacy for the refill. If patient does not wish to contact the pharmacy document the reason why and proceed with request.) (Agent: If yes, when and what did the pharmacy advise?)  This is the patient's preferred pharmacy:  [facility] [phone] [address], [place] - [address] [place] [address] Phone: [phone] Fax: [phone]  Is this the correct pharmacy for this prescription? Yes If no, delete pharmacy and type the correct one.   Has the prescription been filled recently? No  Is the patient out of the medication? Yes  Has the patient been seen for an appointment in the last year OR does the patient have an upcoming appointment? Yes  Can we respond through MyChart? Yes  Agent: Please be advised that Rx refills may take up to 3 business days. We ask that you follow-up with your pharmacy.

## 2024-01-21 MED ORDER — VILAZODONE HCL 40 MG PO TABS: 40.0000 mg | ORAL_TABLET | Freq: Every day | ORAL | 1 refills | Status: AC

## 2024-02-27 ENCOUNTER — Encounter

## 2024-06-24 ENCOUNTER — Ambulatory Visit: Admitting: Adult Health
